# Patient Record
Sex: Female | Born: 1965 | Race: White | Hispanic: No
Health system: Southern US, Community
[De-identification: ages and names within clinical notes are randomized; demographics above are authoritative.]

## PROBLEM LIST (undated history)

## (undated) DIAGNOSIS — R519 Headache, unspecified: Secondary | ICD-10-CM

## (undated) HISTORY — PX: TOTAL KNEE ARTHROPLASTY: SHX125

---

## 2019-04-16 LAB — EXTERNAL GENERIC LAB PROCEDURE: COLOGUARD: NEGATIVE

## 2019-04-16 LAB — COLOGUARD: COLOGUARD: NEGATIVE

## 2020-04-12 ENCOUNTER — Other Ambulatory Visit: Payer: Self-pay

## 2020-04-12 ENCOUNTER — Emergency Department
Admission: EM | Admit: 2020-04-12 | Discharge: 2020-04-12 | Disposition: A | Discharge status: Home / Self Care | Payer: 59 | Attending: Emergency Medicine | Admitting: Emergency Medicine

## 2020-04-12 ENCOUNTER — Emergency Department: Payer: 59

## 2020-04-12 DIAGNOSIS — N200 Calculus of kidney: Secondary | ICD-10-CM | POA: Insufficient documentation

## 2020-04-12 DIAGNOSIS — R109 Unspecified abdominal pain: Secondary | ICD-10-CM

## 2020-04-12 LAB — URINALYSIS, COMPLETE (UACMP) WITH MICROSCOPIC
Bilirubin Urine: NEGATIVE
Glucose, UA: NEGATIVE mg/dL
Ketones, ur: NEGATIVE mg/dL
Leukocytes,Ua: NEGATIVE
Nitrite: NEGATIVE
Protein, ur: NEGATIVE mg/dL
Specific Gravity, Urine: 1.02 (ref 1.005–1.030)
pH: 6 (ref 5.0–8.0)

## 2020-04-12 LAB — BASIC METABOLIC PANEL
Anion gap: 10 (ref 5–15)
BUN: 28 mg/dL — ABNORMAL HIGH (ref 6–20)
CO2: 21 mmol/L — ABNORMAL LOW (ref 22–32)
Calcium: 9.6 mg/dL (ref 8.9–10.3)
Chloride: 103 mmol/L (ref 98–111)
Creatinine, Ser: 0.92 mg/dL (ref 0.44–1.00)
GFR, Estimated: >60 mL/min (ref >60)
Glucose, Bld: 111 mg/dL — ABNORMAL HIGH (ref 70–99)
Potassium: 3.8 mmol/L (ref 3.5–5.1)
Sodium: 134 mmol/L — ABNORMAL LOW (ref 135–145)

## 2020-04-12 LAB — CBC
HCT: 38.5 % (ref 36.0–46.0)
Hemoglobin: 13 g/dL (ref 12.0–15.0)
MCH: 28.6 pg (ref 26.0–34.0)
MCHC: 33.8 g/dL (ref 30.0–36.0)
MCV: 84.6 fL (ref 80.0–100.0)
Platelets: 311 10*3/uL (ref 150–400)
RBC: 4.55 MIL/uL (ref 3.87–5.11)
RDW: 12.5 % (ref 11.5–15.5)
WBC: 10.6 10*3/uL — ABNORMAL HIGH (ref 4.0–10.5)
nRBC: 0 % (ref 0.0–0.2)

## 2020-04-12 MED ORDER — CEPHALEXIN 500 MG PO CAPS: 500.0000 mg | ORAL_CAPSULE | Freq: Three times a day (TID) | ORAL | 0 refills | Status: DC

## 2020-04-12 MED ORDER — FENTANYL CITRATE (PF) 100 MCG/2ML IJ SOLN
50.0000 ug | Freq: Once | INTRAMUSCULAR | Status: AC
Start: 2020-04-12 — End: 2020-04-12
  Administered 2020-04-12: 50 ug via INTRAMUSCULAR
  Filled 2020-04-12: qty 2

## 2020-04-12 MED ORDER — TAMSULOSIN HCL 0.4 MG PO CAPS: 0.4000 mg | ORAL_CAPSULE | Freq: Every day | ORAL | 0 refills | Status: AC

## 2020-04-12 MED ORDER — TAMSULOSIN HCL 0.4 MG PO CAPS: 0.4000 mg | ORAL_CAPSULE | Freq: Every day | ORAL | 0 refills | Status: DC

## 2020-04-12 MED ORDER — ONDANSETRON 4 MG PO TBDP: 4.0000 mg | ORAL_TABLET | Freq: Three times a day (TID) | ORAL | 0 refills | Status: AC | PRN

## 2020-04-12 MED ORDER — OXYCODONE-ACETAMINOPHEN 5-325 MG PO TABS: 1.0000 | ORAL_TABLET | Freq: Four times a day (QID) | ORAL | 0 refills | Status: DC | PRN

## 2020-04-12 MED ORDER — OXYCODONE-ACETAMINOPHEN 5-325 MG PO TABS
1.0000 | ORAL_TABLET | Freq: Four times a day (QID) | ORAL | 0 refills | Status: AC | PRN
Start: 2020-04-12 — End: 2020-04-15

## 2020-04-12 MED ORDER — ONDANSETRON 4 MG PO TBDP: 4.0000 mg | ORAL_TABLET | Freq: Three times a day (TID) | ORAL | 0 refills | Status: DC | PRN

## 2020-04-12 MED ORDER — CEPHALEXIN 500 MG PO CAPS: 500.0000 mg | ORAL_CAPSULE | Freq: Three times a day (TID) | ORAL | 0 refills | Status: AC

## 2020-04-12 NOTE — ED Triage Notes (Signed)
Pt states that a few hours ago she started having right flank pain, states that she is also having some difficulty urination, states that she thinks that it may be a kidney stone due to having one 3 years ago. Pt reports currently taking tramadol and prednisone for a knee injury.

## 2020-04-12 NOTE — ED Notes (Signed)
Patient reports right flank pain x 1 day. Patient reports some intermittent nausea as well. Patient reports some relative dysuria x 2 days. Patient reports hx of kidney stones. Patient denies abdominal pain.

## 2020-04-12 NOTE — ED Provider Notes (Signed)
[facility]-EMERGENCY DEPARTMENT  ____________________________________________  Time seen: Approximately 3:15 PM  I have reviewed the triage vital signs and the nursing notes.   HISTORY  Chief Complaint Flank Pain   Historian Patient     HPI Courtney Rivas is a 55 y.o. female with a history of nephrolithiasis, presents to the emergency department with acute right flank pain that started earlier today.  Patient reports some nausea and diminished stream.  She states that it feels similar to kidney stone pain that she experienced 3 years ago. No dysuria or hematuria.  She is currently taking tramadol and prednisone for a left knee injury.  She denies falls or mechanisms of trauma.  No fever noted at home.   No past medical history on file.   Immunizations up to date:  Yes.     No past medical history on file.  There are no problems to display for this patient.     Prior to Admission medications   Medication Sig Start Date End Date Taking? Authorizing Provider  cephALEXin (KEFLEX) 500 MG capsule Take 1 capsule (500 mg total) by mouth 3 (three) times daily for 7 days. 04/12/20 04/19/20 Yes Pia Mau M, PA-C  ondansetron (ZOFRAN ODT) 4 MG disintegrating tablet Take 1 tablet (4 mg total) by mouth every 8 (eight) hours as needed for up to 5 days. 04/12/20 04/17/20 Yes Pia Mau M, PA-C  oxyCODONE-acetaminophen (PERCOCET/ROXICET) 5-325 MG tablet Take 1 tablet by mouth every 6 (six) hours as needed for up to 3 days. 04/12/20 04/15/20 Yes Pia Mau M, PA-C  predniSONE (DELTASONE) 10 MG tablet Take 10 mg by mouth 2 (two) times daily with a meal.   Yes [provider]  tamsulosin (FLOMAX) 0.4 MG CAPS capsule Take 1 capsule (0.4 mg total) by mouth daily for 5 days. 04/12/20 04/17/20 Yes Pia Mau M, PA-C  azelastine (OPTIVAR) 0.05 % ophthalmic solution Apply to eye. 02/25/20   [provider]  meloxicam (MOBIC) 7.5 MG tablet SMARTSIG:1 Tablet(s) By Mouth Every 12  Hours 02/14/20   [provider]  montelukast (SINGULAIR) 10 MG tablet Take 10 mg by mouth daily. 04/08/20   [provider]  traMADol (ULTRAM) 50 MG tablet Take 50 mg by mouth every 8 (eight) hours. 02/04/20   [provider]    Allergies Ivp dye [iodinated diagnostic agents]  No family history on file.  Social History     Review of Systems  Constitutional: No fever/chills Eyes:  No discharge ENT: No upper respiratory complaints. Respiratory: no cough. No SOB/ use of accessory muscles to breath Gastrointestinal:   No nausea, no vomiting.  No diarrhea.  No constipation. Genitourinary: Patient has right sided flank pain.  Musculoskeletal: Negative for musculoskeletal pain. Skin: Negative for rash, abrasions, lacerations, ecchymosis.    ____________________________________________   PHYSICAL EXAM:  VITAL SIGNS: ED Triage Vitals  Enc Vitals Group     BP 04/12/20 1359 (!) 143/90     Pulse Rate 04/12/20 1359 81     Resp 04/12/20 1359 20     Temp 04/12/20 1359 97.7 F (36.5 C)     Temp Source 04/12/20 1359 Oral     SpO2 04/12/20 1359 98 %     Weight 04/12/20 1416 183 lb (83 kg)     Height 04/12/20 1416 5\' 4"  (1.626 m)     Head Circumference --      Peak Flow --      Pain Score 04/12/20 1416 6     Pain Loc --  Pain Edu? --      Excl. in GC? --      Constitutional: Alert and oriented. Well appearing and in no acute distress. Eyes: Conjunctivae are normal. PERRL. EOMI. Head: Atraumatic. Cardiovascular: Normal rate, regular rhythm. Normal S1 and S2.  Good peripheral circulation. Respiratory: Normal respiratory effort without tachypnea or retractions. Lungs CTAB. Good air entry to the bases with no decreased or absent breath sounds Gastrointestinal: Bowel sounds x 4 quadrants. Soft and nontender to palpation. No guarding or rigidity. No distention. Musculoskeletal: Full range of motion to all extremities. No obvious deformities  noted Neurologic:  Normal for age. No gross focal neurologic deficits are appreciated.  Skin:  Skin is warm, dry and intact. No rash noted. Psychiatric: Mood and affect are normal for age. Speech and behavior are normal.   ____________________________________________   LABS (all labs ordered are listed, but only abnormal results are displayed)  Labs Reviewed  URINALYSIS, COMPLETE (UACMP) WITH MICROSCOPIC - Abnormal; Notable for the following components:      Result Value   Hgb urine dipstick TRACE (*)    Bacteria, UA RARE (*)    All other components within normal limits  BASIC METABOLIC PANEL - Abnormal; Notable for the following components:   Sodium 134 (*)    CO2 21 (*)    Glucose, Bld 111 (*)    BUN 28 (*)    All other components within normal limits  CBC - Abnormal; Notable for the following components:   WBC 10.6 (*)    All other components within normal limits  POC URINE PREG, ED   ____________________________________________  EKG   ____________________________________________  RADIOLOGY Geraldo Pitter, personally viewed and evaluated these images (plain radiographs) as part of my medical decision making, as well as reviewing the written report by the radiologist.    CT Renal Stone Study  Result Date: 04/12/2020 CLINICAL DATA:  Right flank pain. EXAM: CT ABDOMEN AND PELVIS WITHOUT CONTRAST TECHNIQUE: Multidetector CT imaging of the abdomen and pelvis was performed following the standard protocol without IV contrast. COMPARISON:  None. FINDINGS: Lower chest: No acute abnormality. Hepatobiliary: No focal liver abnormality is seen. Ill-defined gallstones are seen within the lumen of an otherwise normal-appearing gallbladder. Pancreas: Unremarkable. No pancreatic ductal dilatation or surrounding inflammatory changes. Spleen: Normal in size without focal abnormality. Adrenals/Urinary Tract: Adrenal glands are unremarkable. Kidneys are normal in size, without focal  lesions. A 3 mm obstructing renal stone seen at the right UVJ, with mild right-sided hydronephrosis, hydroureter and perinephric inflammatory fat stranding. Bladder is unremarkable. Stomach/Bowel: Stomach is within normal limits. Appendix appears normal. No evidence of bowel wall thickening, distention, or inflammatory changes. Noninflamed diverticula are seen within the sigmoid colon. Vascular/Lymphatic: Aortic atherosclerosis. No enlarged abdominal or pelvic lymph nodes. Reproductive: The uterus is normal in size and appearance. A 4.1 cm x 3.7 cm well-defined area of heterogeneous attenuation is seen within the right adnexa. Other: No abdominal wall hernia or abnormality. No abdominopelvic ascites. Musculoskeletal: No acute or significant osseous findings. IMPRESSION: 1. 3 mm obstructing renal stone at the right UVJ. 2. Cholelithiasis. 3. Sigmoid diverticulosis. 4. Aortic atherosclerosis. Aortic Atherosclerosis (ICD10-I70.0). Electronically Signed   By: Aram Candela M.D.   On: 04/12/2020 15:41    ____________________________________________    PROCEDURES  Procedure(s) performed:     Procedures     Medications  fentaNYL (SUBLIMAZE) injection 50 mcg (50 mcg Intramuscular Given 04/12/20 1538)     ____________________________________________   INITIAL IMPRESSION / ASSESSMENT AND  PLAN / ED COURSE  Pertinent labs & imaging results that were available during my care of the patient were reviewed by me and considered in my medical decision making (see chart for details).      Assessment and plan:  Flank pain:  55 year old female presents to the emergency department with right-sided flank pain that started today.  Patient was hypertensive at triage but vital signs were otherwise reassuring.  CT renal stone study indicated a 3 mm obstructing renal calculi at the UVJ.  Patient was given fentanyl in the emergency department for pain.  She was discharged with a short course of Percocet  and Flomax.  She was advised to hold her Toradol while taking Percocet.  There were rare bacteria identified on UA and patient was started on Keflex.  She was advised to follow-up with urology as needed.  All patient questions were answered.   ____________________________________________  FINAL CLINICAL IMPRESSION(S) / ED DIAGNOSES  Final diagnoses:  Flank pain  Nephrolithiasis      NEW MEDICATIONS STARTED DURING THIS VISIT:  ED Discharge Orders         Ordered    oxyCODONE-acetaminophen (PERCOCET/ROXICET) 5-325 MG tablet  Every 6 hours PRN        04/12/20 1557    tamsulosin (FLOMAX) 0.4 MG CAPS capsule  Daily        04/12/20 1557    cephALEXin (KEFLEX) 500 MG capsule  3 times daily        04/12/20 1557    ondansetron (ZOFRAN ODT) 4 MG disintegrating tablet  Every 8 hours PRN        04/12/20 1558              This chart was dictated using voice recognition software/Dragon. Despite best efforts to proofread, errors can occur which can change the meaning. Any change was purely unintentional.     Orvil Feil, PA-C 04/12/20 1608    Minna Antis, MD 04/12/20 2248

## 2020-04-12 NOTE — Discharge Instructions (Addendum)
Hold Tramadol Take Roxicet for pain Take Flomax once daily  Take Keflex three times daily for the next seven days.

## 2020-11-16 ENCOUNTER — Other Ambulatory Visit: Payer: Self-pay | Admitting: Surgery

## 2020-11-16 ENCOUNTER — Other Ambulatory Visit (HOSPITAL_COMMUNITY): Payer: Self-pay | Admitting: Surgery

## 2020-11-16 DIAGNOSIS — M67951 Unspecified disorder of synovium and tendon, right thigh: Secondary | ICD-10-CM

## 2020-11-16 DIAGNOSIS — Z96652 Presence of left artificial knee joint: Secondary | ICD-10-CM

## 2020-11-16 DIAGNOSIS — T8484XD Pain due to internal orthopedic prosthetic devices, implants and grafts, subsequent encounter: Secondary | ICD-10-CM

## 2020-12-08 ENCOUNTER — Other Ambulatory Visit
Admission: RE | Admit: 2020-12-08 | Discharge: 2020-12-08 | Disposition: A | Discharge status: Home / Self Care | Payer: 59 | Source: Ambulatory Visit | Attending: Sports Medicine | Admitting: Sports Medicine

## 2020-12-08 DIAGNOSIS — M25462 Effusion, left knee: Secondary | ICD-10-CM | POA: Diagnosis not present

## 2020-12-08 LAB — SYNOVIAL CELL COUNT + DIFF, W/ CRYSTALS
Crystals, Fluid: NONE SEEN
Eosinophils-Synovial: 0 %
Lymphocytes-Synovial Fld: 93 %
Monocyte-Macrophage-Synovial Fluid: 2 %
Neutrophil, Synovial: 5 %
Other Cells-SYN: 0
WBC, Synovial: 76 /mm3 (ref 0–200)

## 2020-12-09 ENCOUNTER — Ambulatory Visit: Payer: 59

## 2020-12-09 ENCOUNTER — Other Ambulatory Visit: Payer: Self-pay

## 2020-12-09 ENCOUNTER — Ambulatory Visit
Admission: RE | Admit: 2020-12-09 | Discharge: 2020-12-09 | Disposition: A | Discharge status: Home / Self Care | Payer: 59 | Source: Ambulatory Visit | Attending: Surgery | Admitting: Surgery

## 2020-12-09 DIAGNOSIS — M67951 Unspecified disorder of synovium and tendon, right thigh: Secondary | ICD-10-CM | POA: Insufficient documentation

## 2020-12-09 DIAGNOSIS — T8484XD Pain due to internal orthopedic prosthetic devices, implants and grafts, subsequent encounter: Secondary | ICD-10-CM | POA: Diagnosis present

## 2020-12-09 DIAGNOSIS — Z96652 Presence of left artificial knee joint: Secondary | ICD-10-CM | POA: Insufficient documentation

## 2021-01-09 ENCOUNTER — Ambulatory Visit
Admission: RE | Admit: 2021-01-09 | Discharge: 2021-01-09 | Disposition: A | Discharge status: Home / Self Care | Payer: 59 | Source: Ambulatory Visit | Attending: Surgery | Admitting: Surgery

## 2021-01-09 DIAGNOSIS — T8484XD Pain due to internal orthopedic prosthetic devices, implants and grafts, subsequent encounter: Secondary | ICD-10-CM | POA: Diagnosis present

## 2021-01-09 DIAGNOSIS — Z96652 Presence of left artificial knee joint: Secondary | ICD-10-CM | POA: Diagnosis present

## 2021-02-05 ENCOUNTER — Encounter (HOSPITAL_COMMUNITY): Payer: Self-pay | Admitting: Radiology

## 2021-05-29 ENCOUNTER — Other Ambulatory Visit: Payer: Self-pay | Admitting: Surgery

## 2021-05-29 DIAGNOSIS — M4807 Spinal stenosis, lumbosacral region: Secondary | ICD-10-CM

## 2021-06-15 ENCOUNTER — Other Ambulatory Visit: Payer: Self-pay | Admitting: Surgery

## 2021-06-15 DIAGNOSIS — M4800 Spinal stenosis, site unspecified: Secondary | ICD-10-CM

## 2021-07-20 ENCOUNTER — Other Ambulatory Visit: Payer: Self-pay | Admitting: Surgery

## 2021-07-20 DIAGNOSIS — M47816 Spondylosis without myelopathy or radiculopathy, lumbar region: Secondary | ICD-10-CM

## 2021-07-20 DIAGNOSIS — M4807 Spinal stenosis, lumbosacral region: Secondary | ICD-10-CM

## 2021-08-06 ENCOUNTER — Other Ambulatory Visit: Payer: Self-pay | Admitting: Surgery

## 2021-08-06 ENCOUNTER — Other Ambulatory Visit (HOSPITAL_COMMUNITY): Payer: Self-pay | Admitting: Surgery

## 2021-08-06 DIAGNOSIS — M47816 Spondylosis without myelopathy or radiculopathy, lumbar region: Secondary | ICD-10-CM

## 2021-09-07 ENCOUNTER — Ambulatory Visit: Admission: RE | Admit: 2021-09-07 | Payer: 59 | Source: Ambulatory Visit

## 2021-09-07 ENCOUNTER — Ambulatory Visit
Admission: RE | Admit: 2021-09-07 | Discharge: 2021-09-07 | Disposition: A | Discharge status: Home / Self Care | Payer: 59 | Source: Ambulatory Visit | Attending: Surgery | Admitting: Surgery

## 2021-09-07 DIAGNOSIS — M47816 Spondylosis without myelopathy or radiculopathy, lumbar region: Secondary | ICD-10-CM | POA: Diagnosis present

## 2021-12-26 ENCOUNTER — Other Ambulatory Visit: Payer: Self-pay | Admitting: Surgery

## 2021-12-26 DIAGNOSIS — Z96652 Presence of left artificial knee joint: Secondary | ICD-10-CM

## 2022-02-21 ENCOUNTER — Other Ambulatory Visit: Payer: Self-pay | Admitting: Surgery

## 2022-02-21 DIAGNOSIS — Z96652 Presence of left artificial knee joint: Secondary | ICD-10-CM

## 2022-02-21 DIAGNOSIS — Z96649 Presence of unspecified artificial hip joint: Secondary | ICD-10-CM

## 2022-02-28 ENCOUNTER — Ambulatory Visit: Payer: 59

## 2022-04-19 ENCOUNTER — Ambulatory Visit: Payer: Self-pay | Admitting: Family Medicine

## 2022-04-28 IMAGING — CT CT KNEE*L* W/O CM
4 of 6 series · 14 of 33 positions shown, 16 images · non-contrast
Comparison: None.

CLINICAL DATA: Left knee pain status post total left knee
replacement.

EXAM:
CT OF THE LEFT KNEE WITHOUT CONTRAST
TECHNIQUE: Multidetector CT imaging of the left knee was performed according to
the standard protocol. Multiplanar CT image reconstructions were
also generated.

[Series 4: axial bone · axial · 0.41mm/px · z∈[+484,+672]mm · 5 of 189 slices shown, 7 images]
[im 32/189  soft-tissue]
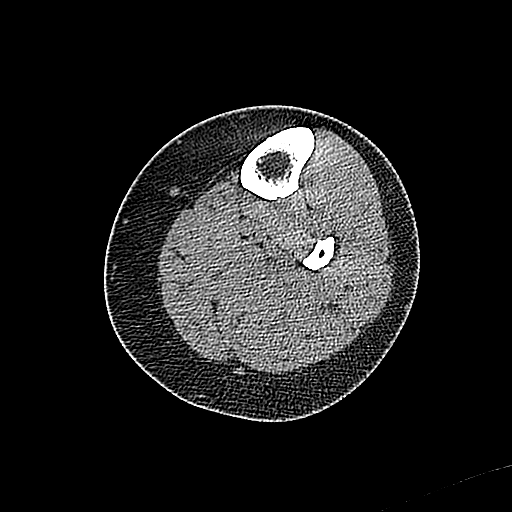
[im 32/189  bone]
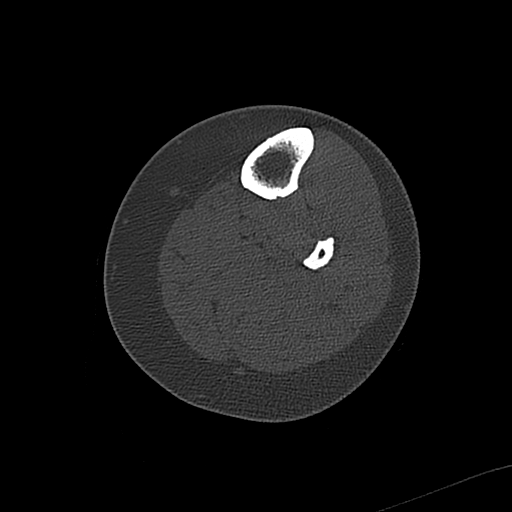
[im 63/189  bone]
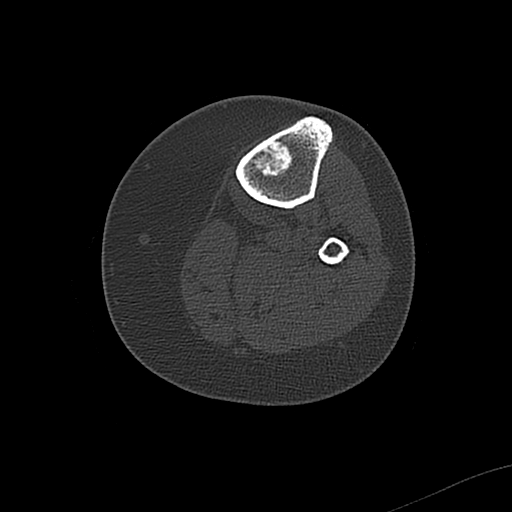
[im 95/189  bone]
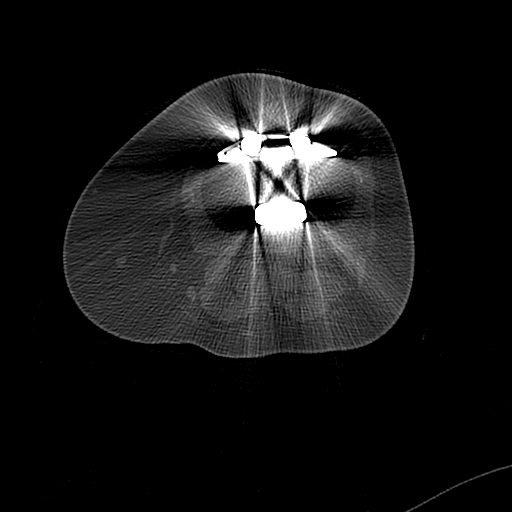
[im 126/189  bone]
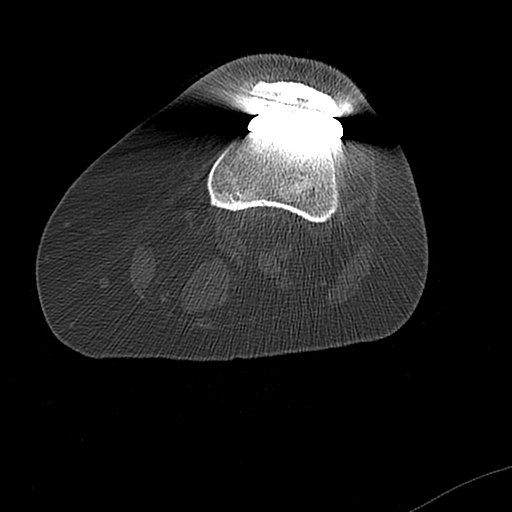
[im 157/189  soft-tissue]
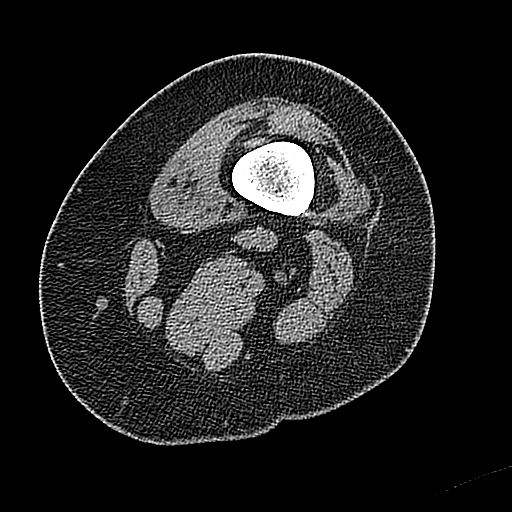
[im 157/189  bone]
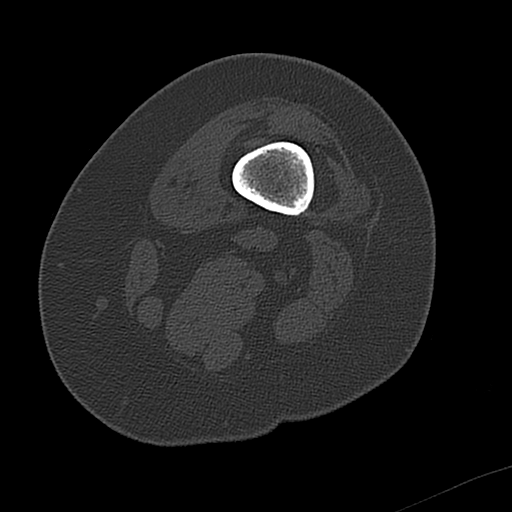

[Series 5: axial (person_name) · axial · 0.41mm/px · z∈[+484,+578]mm · 3 of 189 slices shown]
[im 32/189  bone]
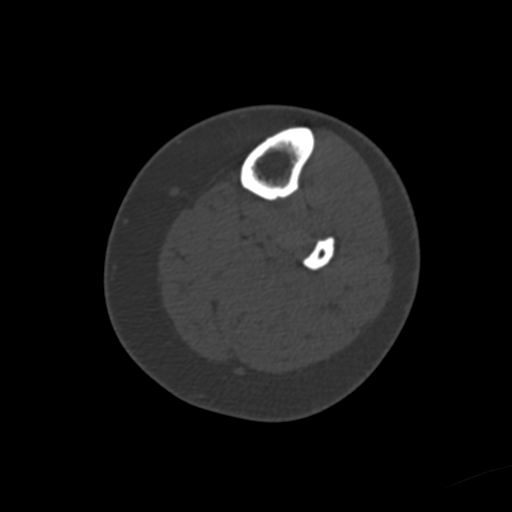
[im 63/189  bone]
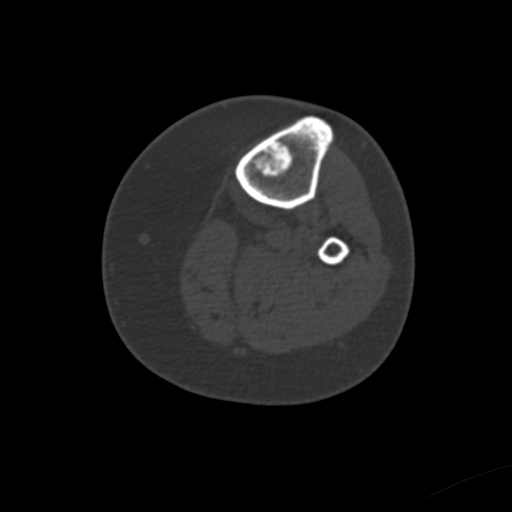
[im 95/189  bone]
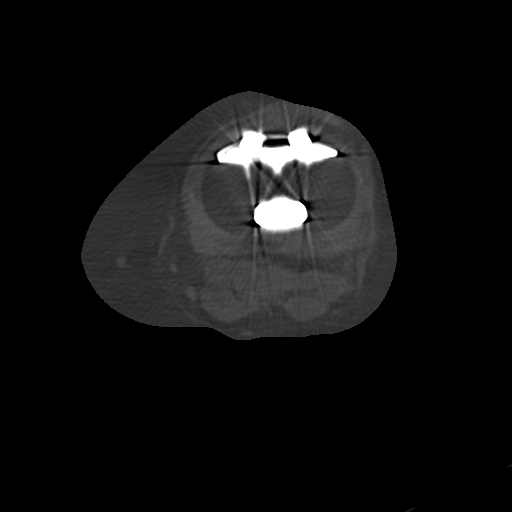

[Series 6: coronal bone · coronal · 0.39mm/px · 1 of 131 slices shown]
[im 66/131  bone]
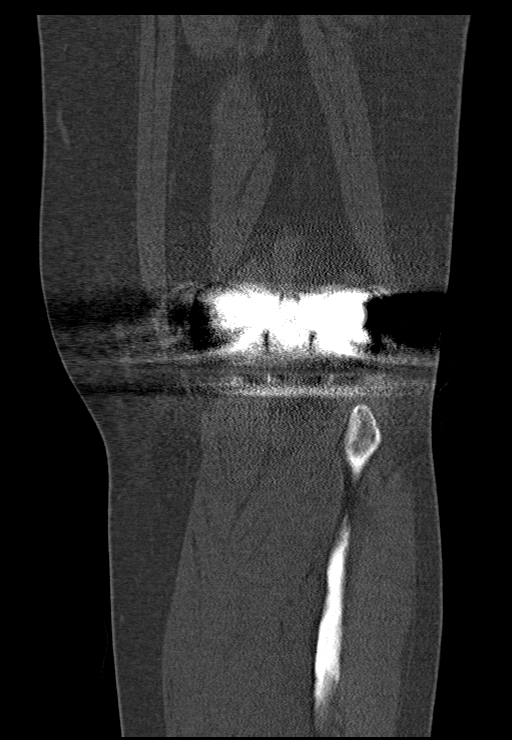

[Series 7: sagittal bone · sagittal · 0.37mm/px · 5 of 122 slices shown]
[im 21/122  bone]
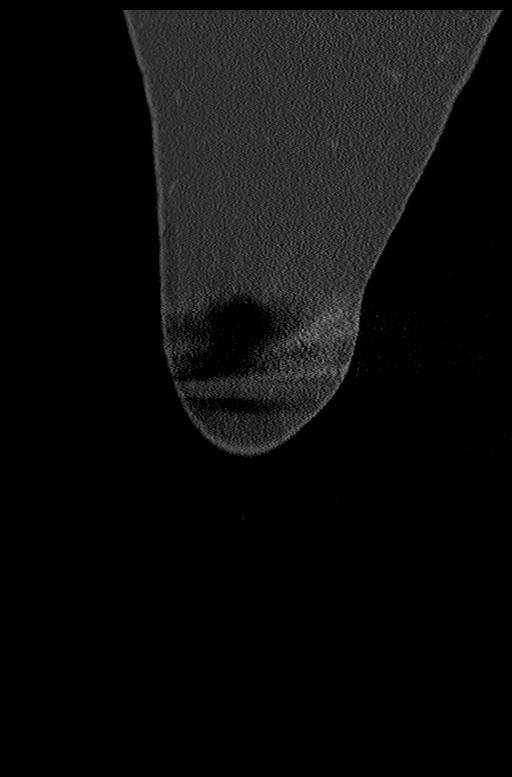
[im 41/122  bone]
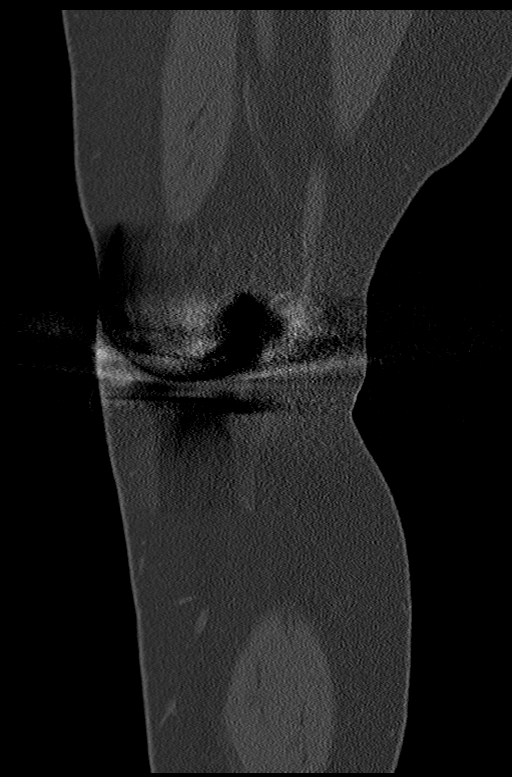
[im 61/122  bone]
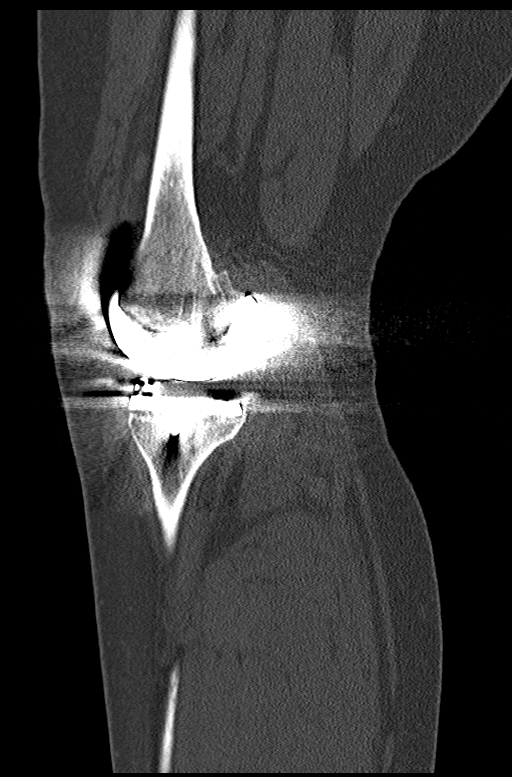
[im 81/122  bone]
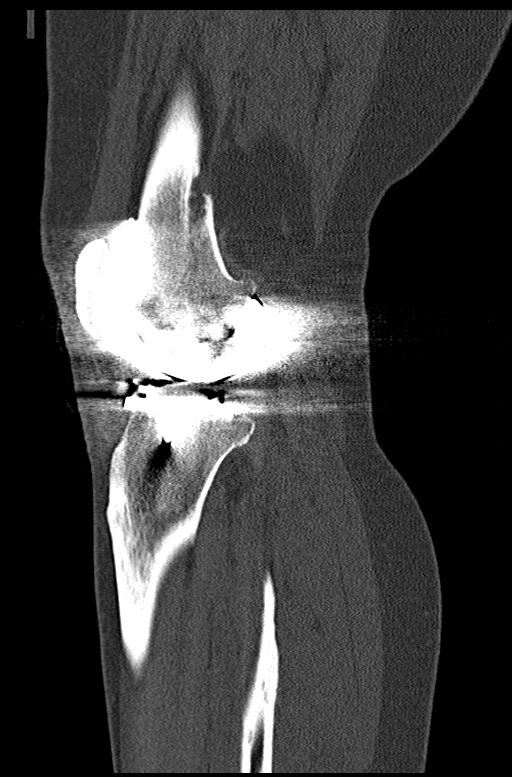
[im 101/122  bone]
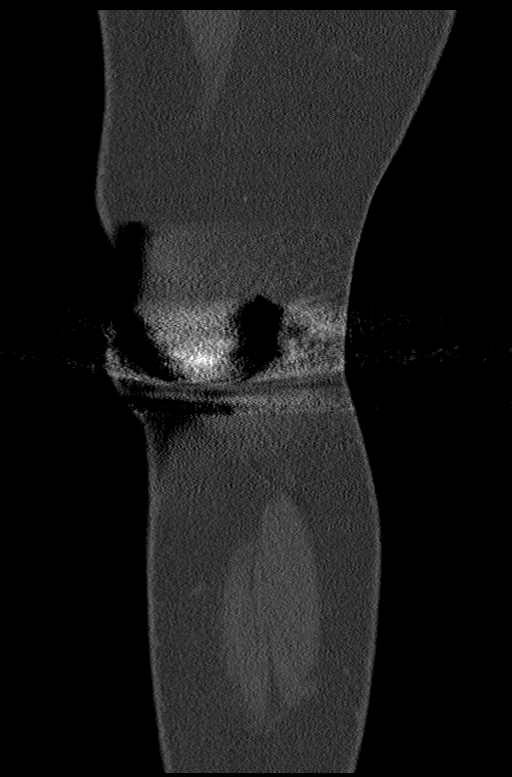

[14 of 33 positions shown; findings below may reference images not displayed]

FINDINGS: FINDINGS

Bones/Joint/Cartilage

Postsurgical changes status post left total knee arthroplasty.
Arthroplasty components are in their expected alignment. No
periprosthetic fracture. Focal periprosthetic cystic change within
the medial tibial plateau measuring 14 x 10 x 14 mm with mild
adjacent perihardware lucency (series 6, images 46-48). Prominent
reaming track within the distal femur which extends through the
posterolateral cortex of the distal femoral metadiaphysis (series 6,
images 46-49). Osseous structures appear otherwise intact and
unremarkable. Trace knee joint effusion, nonspecific.

Ligaments

Suboptimally assessed by CT.

Muscles and Tendons

No acute musculotendinous abnormality by CT. Extensor mechanism
appears grossly intact within the limitations of this exam.

Soft tissues

No soft tissue edema or fluid collection.
IMPRESSION: 1. Postsurgical changes status post left total knee arthroplasty
without periprosthetic fracture.
2. Focal periprosthetic cystic change within the medial tibial
plateau measuring 14 x 10 x 14 mm with mild adjacent perihardware
lucency, concerning for loosening.
3. Prominent reaming track within the distal femur which extends
through the posterolateral cortex of the distal femoral
metadiaphysis.
4. Trace knee joint effusion, nonspecific.

## 2022-12-01 LAB — EXTERNAL GENERIC LAB PROCEDURE: COLOGUARD: NEGATIVE

## 2022-12-01 LAB — COLOGUARD: COLOGUARD: NEGATIVE

## 2023-05-22 ENCOUNTER — Other Ambulatory Visit: Payer: Self-pay | Admitting: Surgery

## 2023-06-02 ENCOUNTER — Encounter
Admission: RE | Admit: 2023-06-02 | Discharge: 2023-06-02 | Disposition: A | Discharge status: Home / Self Care | Source: Ambulatory Visit | Attending: Surgery | Admitting: Surgery

## 2023-06-02 ENCOUNTER — Encounter: Payer: Self-pay | Admitting: Urgent Care

## 2023-06-02 ENCOUNTER — Other Ambulatory Visit: Payer: Self-pay

## 2023-06-02 VITALS — Ht 64.0 in | Wt 200.0 lb

## 2023-06-02 DIAGNOSIS — Z01818 Encounter for other preprocedural examination: Secondary | ICD-10-CM | POA: Diagnosis not present

## 2023-06-02 DIAGNOSIS — Z01810 Encounter for preprocedural cardiovascular examination: Secondary | ICD-10-CM | POA: Diagnosis present

## 2023-06-02 DIAGNOSIS — Z01812 Encounter for preprocedural laboratory examination: Secondary | ICD-10-CM | POA: Diagnosis present

## 2023-06-02 HISTORY — DX: Headache, unspecified: R51.9

## 2023-06-02 LAB — CBC WITH DIFFERENTIAL/PLATELET
Abs Immature Granulocytes: 0.02 10*3/uL (ref 0.00–0.07)
Basophils Absolute: 0 10*3/uL (ref 0.0–0.1)
Basophils Relative: 0 %
Eosinophils Absolute: 0.1 10*3/uL (ref 0.0–0.5)
Eosinophils Relative: 2 %
HCT: 40.7 % (ref 36.0–46.0)
Hemoglobin: 14 g/dL (ref 12.0–15.0)
Immature Granulocytes: 0 %
Lymphocytes Relative: 28 %
Lymphs Abs: 2 10*3/uL (ref 0.7–4.0)
MCH: 28.9 pg (ref 26.0–34.0)
MCHC: 34.4 g/dL (ref 30.0–36.0)
MCV: 84.1 fL (ref 80.0–100.0)
Monocytes Absolute: 0.6 10*3/uL (ref 0.1–1.0)
Monocytes Relative: 8 %
Neutro Abs: 4.3 10*3/uL (ref 1.7–7.7)
Neutrophils Relative %: 62 %
Platelets: 329 10*3/uL (ref 150–400)
RBC: 4.84 MIL/uL (ref 3.87–5.11)
RDW: 12.1 % (ref 11.5–15.5)
WBC: 7 10*3/uL (ref 4.0–10.5)
nRBC: 0 % (ref 0.0–0.2)

## 2023-06-02 LAB — SURGICAL PCR SCREEN
MRSA, PCR: NEGATIVE
Staphylococcus aureus: NEGATIVE

## 2023-06-02 LAB — URINALYSIS, ROUTINE W REFLEX MICROSCOPIC
Bilirubin Urine: NEGATIVE
Glucose, UA: NEGATIVE mg/dL
Hgb urine dipstick: NEGATIVE
Ketones, ur: NEGATIVE mg/dL
Leukocytes,Ua: NEGATIVE
Nitrite: NEGATIVE
Protein, ur: NEGATIVE mg/dL
Specific Gravity, Urine: 1.002 — ABNORMAL LOW (ref 1.005–1.030)
pH: 7 (ref 5.0–8.0)

## 2023-06-02 LAB — COMPREHENSIVE METABOLIC PANEL WITH GFR
ALT: 17 U/L (ref 0–44)
AST: 19 U/L (ref 15–41)
Albumin: 4.4 g/dL (ref 3.5–5.0)
Alkaline Phosphatase: 69 U/L (ref 38–126)
Anion gap: 8 (ref 5–15)
BUN: 17 mg/dL (ref 6–20)
CO2: 23 mmol/L (ref 22–32)
Calcium: 9.4 mg/dL (ref 8.9–10.3)
Chloride: 106 mmol/L (ref 98–111)
Creatinine, Ser: 0.48 mg/dL (ref 0.44–1.00)
GFR, Estimated: >60 mL/min (ref >60)
Glucose, Bld: 91 mg/dL (ref 70–99)
Potassium: 3.9 mmol/L (ref 3.5–5.1)
Sodium: 137 mmol/L (ref 135–145)
Total Bilirubin: 0.7 mg/dL (ref 0.0–1.2)
Total Protein: 7.3 g/dL (ref 6.5–8.1)

## 2023-06-02 NOTE — Patient Instructions (Addendum)
 Your procedure is scheduled on: Tuesday 06/10/23 To find out your arrival time, please call [phone] between 1PM - 3PM on:  Monday 06/09/23  Report to the Registration Desk on the 1st floor of the [facility]. Free Valet parking is available.  If your arrival time is 6:00 am, do not arrive before that time as the [facility] entrance doors do not open until 6:00 am.  REMEMBER: Instructions that are not followed completely may result in serious medical risk, up to and including death; or upon the discretion of your surgeon and anesthesiologist your surgery may need to be rescheduled.  Do not eat food after midnight the night before surgery.  No gum chewing or hard candies.  You may however, drink CLEAR liquids up to 2 hours before you are scheduled to arrive for your surgery. Do not drink anything within 2 hours of your scheduled arrival time.  Clear liquids include: - water  - apple juice without pulp - gatorade (not RED colors) - black coffee or tea (Do NOT add milk or creamers to the coffee or tea) Do NOT drink anything that is not on this list.  Type 1 and Type 2 diabetics should only drink water.  In addition, your doctor has ordered for you to drink the provided:  Ensure Pre-Surgery Clear Carbohydrate Drink  Drinking this carbohydrate drink up to two hours before surgery helps to reduce insulin resistance and improve patient outcomes. Please complete drinking 2 hours before scheduled arrival time.  One week prior to surgery: Stop Anti-inflammatories (NSAIDS) such as Advil, Aleve, Ibuprofen, Motrin, Naproxen, Naprosyn and Aspirin based products such as Excedrin, Goody's Powder, BC Powder. You will need to also stop your Meloxicam today 06/02/23 You may however, continue to take Tylenol  if needed for pain up until the day of surgery.  Stop ANY OVER THE COUNTER supplements and vitamins also today 06/02/23 until after surgery.  Continue taking all prescribed medications with the  exception of the following: (see meloxicam above)  TAKE ONLY THESE MEDICATIONS THE MORNING OF SURGERY WITH A SIP OF WATER:  cetirizine (ZYRTEC) 10 MG tablet  gabapentin (NEURONTIN) 600 MG capsule  montelukast (SINGULAIR) 10 MG tablet   No Alcohol for 24 hours before or after surgery.  No Smoking including e-cigarettes for 24 hours before surgery.  No chewable tobacco products for at least 6 hours before surgery.  No nicotine patches on the day of surgery.  Do not use any "recreational" drugs for at least a week (preferably 2 weeks) before your surgery.  Please be advised that the combination of cocaine and anesthesia may have negative outcomes, up to and including death. If you test positive for cocaine, your surgery will be cancelled.  On the morning of surgery brush your teeth with toothpaste and water, you may rinse your mouth with mouthwash if you wish. Do not swallow any toothpaste or mouthwash.  Use CHG Soap or wipes as directed on instruction sheet.  Do not wear lotions, powders, or perfumes on the day of surgery.  Do not shave body hair from the neck down (see below).  Wear comfortable clothing (specific to your surgery type) to the hospital.  Do not wear jewelry, make-up, hairpins, clips or nail or toenail polish.  For welded (permanent) jewelry: bracelets, anklets, waist bands, etc.  Please have this removed prior to surgery.  If it is not removed, there is a chance that hospital personnel will need to cut it off on the day of surgery. Contact  lenses, hearing aids and dentures may not be worn into surgery. Bring a case for your glasses  Do not bring valuables to the hospital. [facility] is not responsible for any missing/lost belongings or valuables.   Notify your doctor if there is any change in your medical condition (cold, fever, infection).  If you are being discharged the day of surgery, you will not be allowed to drive home. You will need a responsible  individual to drive you home and stay with you for 24 hours after surgery.   If you are taking public transportation, you will need to have a responsible individual with you.  If you are being admitted to the hospital overnight, leave your suitcase in the car. After surgery it may be brought to your room.  In case of increased patient census, it may be necessary for you, the patient, to continue your postoperative care in the Same Day Surgery department.  After surgery, you can help prevent lung complications by doing breathing exercises.  Take deep breaths and cough every 1-2 hours. Your doctor may order a device called an Incentive Spirometer to help you take deep breaths.  Surgery Visitation Policy:  Patients undergoing a surgery or procedure may have two family members or support persons with them as long as the person is not COVID-19 positive or experiencing its symptoms.   Please call the Pre-admissions Testing Dept. at [phone] if you have any questions about these instructions.    Pre-operative 5 CHG Bath Instructions   You can play a key role in reducing the risk of infection after surgery. Your skin needs to be as free of germs as possible. You can reduce the number of germs on your skin by washing with CHG (chlorhexidine gluconate) soap before surgery. CHG is an antiseptic soap that kills germs and continues to kill germs even after washing.   DO NOT use if you have an allergy to chlorhexidine/CHG or antibacterial soaps. If your skin becomes reddened or irritated, stop using the CHG and notify one of our RNs at [phone].   Please shower with the CHG soap starting 4 days before surgery using the following schedule:   Friday 06/06/23 - Tuesday 06/10/23    Please keep in mind the following:  DO NOT shave, including legs and underarms, starting the day of your first shower.   You may shave your face at any point before/day of surgery.  Place clean sheets on your bed  the day you start using CHG soap. Use a clean washcloth (not used since being washed) for each shower. DO NOT sleep with pets once you start using the CHG.   CHG Shower Instructions:  If you choose to wash your hair and private area, wash first with your normal shampoo/soap.  After you use shampoo/soap, rinse your hair and body thoroughly to remove shampoo/soap residue.  Turn the water OFF and apply about 3 tablespoons (45 ml) of CHG soap to a CLEAN washcloth.  Apply CHG soap ONLY FROM YOUR NECK DOWN TO YOUR TOES (washing for 3-5 minutes)  DO NOT use CHG soap on face, private areas, open wounds, or sores.  Pay special attention to the area where your surgery is being performed.  If you are having back surgery, having someone wash your back for you may be helpful. Wait 2 minutes after CHG soap is applied, then you may rinse off the CHG soap.  Pat dry with a clean towel  Put on clean clothes/pajamas  If you choose to wear lotion, please use ONLY the CHG-compatible lotions on the back of this paper.     Additional instructions for the day of surgery: DO NOT APPLY any lotions, deodorants, cologne, or perfumes.   Put on clean/comfortable clothes.  Brush your teeth.  Ask your nurse before applying any prescription medications to the skin.      CHG Compatible Lotions   Aveeno Moisturizing lotion  Cetaphil Moisturizing Cream  Cetaphil Moisturizing Lotion  Clairol Herbal Essence Moisturizing Lotion, Dry Skin  Clairol Herbal Essence Moisturizing Lotion, Extra Dry Skin  Clairol Herbal Essence Moisturizing Lotion, Normal Skin  Curel Age Defying Therapeutic Moisturizing Lotion with Alpha Hydroxy  Curel Extreme Care Body Lotion  Curel Soothing Hands Moisturizing Hand Lotion  Curel Therapeutic Moisturizing Cream, Fragrance-Free  Curel Therapeutic Moisturizing Lotion, Fragrance-Free  Curel Therapeutic Moisturizing Lotion, Original Formula  Eucerin Daily Replenishing Lotion  Eucerin Dry  Skin Therapy Plus Alpha Hydroxy Crme  Eucerin Dry Skin Therapy Plus Alpha Hydroxy Lotion  Eucerin Original Crme  Eucerin Original Lotion  Eucerin Plus Crme Eucerin Plus Lotion  Eucerin TriLipid Replenishing Lotion  Keri Anti-Bacterial Hand Lotion  Keri Deep Conditioning Original Lotion Dry Skin Formula Softly Scented  Keri Deep Conditioning Original Lotion, Fragrance Free Sensitive Skin Formula  Keri Lotion Fast Absorbing Fragrance Free Sensitive Skin Formula  Keri Lotion Fast Absorbing Softly Scented Dry Skin Formula  Keri Original Lotion  Keri Skin Renewal Lotion Keri Silky Smooth Lotion  Keri Silky Smooth Sensitive Skin Lotion  Nivea Body Creamy Conditioning Oil  Nivea Body Extra Enriched Lotion  Nivea Body Original Lotion  Nivea Body Sheer Moisturizing Lotion Nivea Crme  Nivea Skin Firming Lotion  NutraDerm 30 Skin Lotion  NutraDerm Skin Lotion  NutraDerm Therapeutic Skin Cream  NutraDerm Therapeutic Skin Lotion  ProShield Protective Hand Cream  Provon moisturizing lotion  How to Use an Incentive Spirometer  An incentive spirometer is a tool that measures how well you are filling your lungs with each breath. Learning to take long, deep breaths using this tool can help you keep your lungs clear and active. This may help to reverse or lessen your chance of developing breathing (pulmonary) problems, especially infection. You may be asked to use a spirometer: After a surgery. If you have a lung problem or a history of smoking. After a long period of time when you have been unable to move or be active. If the spirometer includes an indicator to show the highest number that you have reached, your health care provider or respiratory therapist will help you set a goal. Keep a log of your progress as told by your health care provider. What are the risks? Breathing too quickly may cause dizziness or cause you to pass out. Take your time so you do not get dizzy or light-headed. If you  are in pain, you may need to take pain medicine before doing incentive spirometry. It is harder to take a deep breath if you are having pain.   Sit up on the edge of your bed or on a chair. Hold the incentive spirometer so that it is in an upright position. Before you use the spirometer, breathe out normally. Place the mouthpiece in your mouth. Make sure your lips are closed tightly around it. Breathe in slowly and as deeply as you can through your mouth, causing the piston or the ball to rise toward the top of the chamber. Hold your breath for 3-5 seconds, or for as long as possible.  If the spirometer includes a coach indicator, use this to guide you in breathing. Slow down your breathing if the indicator goes above the marked areas. Remove the mouthpiece from your mouth and breathe out normally. The piston or ball will return to the bottom of the chamber. Rest for a few seconds, then repeat the steps 10 or more times. Take your time and take a few normal breaths between deep breaths so that you do not get dizzy or light-headed. Do this every 1-2 hours when you are awake. If the spirometer includes a goal marker to show the highest number you have reached (best effort), use this as a goal to work toward during each repetition. After each set of 10 deep breaths, cough a few times. This will help to make sure that your lungs are clear. If you have an incision on your chest or abdomen from surgery, place a pillow or a rolled-up towel firmly against the incision when you cough. This can help to reduce pain while taking deep breaths and coughing. General tips When you are able to get out of bed: Walk around often. Continue to take deep breaths and cough in order to clear your lungs. Keep using the incentive spirometer until your health care provider says it is okay to stop using it. If you have been in the hospital, you may be told to keep using the spirometer at home. Contact a health care  provider if: You are having difficulty using the spirometer. You have trouble using the spirometer as often as instructed. Your pain medicine is not giving enough relief for you to use the spirometer as told. You have a fever. Get help right away if: You develop shortness of breath. You develop a cough with bloody mucus from the lungs. You have fluid or blood coming from an incision site after you cough. Summary An incentive spirometer is a tool that can help you learn to take long, deep breaths to keep your lungs clear and active. You may be asked to use a spirometer after a surgery, if you have a lung problem or a history of smoking, or if you have been inactive for a long period of time. Use your incentive spirometer as instructed every 1-2 hours while you are awake. If you have an incision on your chest or abdomen, place a pillow or a rolled-up towel firmly against your incision when you cough. This will help to reduce pain. Get help right away if you have shortness of breath, you cough up bloody mucus, or blood comes from your incision when you cough. This information is not intended to replace advice given to you by your health care provider. Make sure you discuss any questions you have with your health care provider. Document Revised: 04/05/2019 Document Reviewed: 04/05/2019 Elsevier Patient Education  2023 [employer] Inc[employer]    Preoperative Educational Videos for Total Hip, Knee and Shoulder Replacements  To better prepare for surgery, please view our videos that explain the physical activity and discharge planning required to have the best surgical recovery at [facility].  [email or web address]  Questions? Call [phone] or email [email or web address]

## 2023-06-10 ENCOUNTER — Encounter: Payer: Self-pay | Admitting: Surgery

## 2023-06-10 ENCOUNTER — Ambulatory Visit

## 2023-06-10 ENCOUNTER — Ambulatory Visit: Admitting: Certified Registered"

## 2023-06-10 ENCOUNTER — Encounter
Admission: RE | Disposition: A | Discharge status: Home Health | Payer: Self-pay | Source: Ambulatory Visit | Attending: Surgery

## 2023-06-10 ENCOUNTER — Ambulatory Visit
Admission: RE | Admit: 2023-06-10 | Discharge: 2023-06-11 | Disposition: A | Discharge status: Home Health | Source: Ambulatory Visit | Attending: Surgery | Admitting: Surgery

## 2023-06-10 ENCOUNTER — Other Ambulatory Visit: Payer: Self-pay

## 2023-06-10 DIAGNOSIS — Z96651 Presence of right artificial knee joint: Secondary | ICD-10-CM

## 2023-06-10 DIAGNOSIS — Z791 Long term (current) use of non-steroidal anti-inflammatories (NSAID): Secondary | ICD-10-CM | POA: Diagnosis not present

## 2023-06-10 DIAGNOSIS — M1711 Unilateral primary osteoarthritis, right knee: Secondary | ICD-10-CM | POA: Insufficient documentation

## 2023-06-10 DIAGNOSIS — Z87891 Personal history of nicotine dependence: Secondary | ICD-10-CM | POA: Diagnosis not present

## 2023-06-10 DIAGNOSIS — Z7901 Long term (current) use of anticoagulants: Secondary | ICD-10-CM | POA: Diagnosis not present

## 2023-06-10 DIAGNOSIS — Z79899 Other long term (current) drug therapy: Secondary | ICD-10-CM | POA: Diagnosis not present

## 2023-06-10 DIAGNOSIS — Z01818 Encounter for other preprocedural examination: Secondary | ICD-10-CM

## 2023-06-10 HISTORY — PX: TOTAL KNEE ARTHROPLASTY: SHX125

## 2023-06-10 LAB — POCT PREGNANCY, URINE: Preg Test, Ur: NEGATIVE

## 2023-06-10 SURGERY — ARTHROPLASTY, KNEE, TOTAL
Anesthesia: Spinal | Site: Knee | Laterality: Right

## 2023-06-10 MED ORDER — METOCLOPRAMIDE HCL 5 MG/ML IJ SOLN: 5.0000 mg | Freq: Three times a day (TID) | INTRAMUSCULAR | Status: DC | PRN

## 2023-06-10 MED ORDER — VITAMIN D 25 MCG (1000 UNIT) PO TABS
1000.0000 [IU] | ORAL_TABLET | Freq: Every day | ORAL | Status: DC
  Administered 2023-06-10 – 2023-06-11 (×2): 1000 [IU] via ORAL
  Filled 2023-06-10 (×2): qty 1

## 2023-06-10 MED ORDER — LORATADINE 10 MG PO TABS
10.0000 mg | ORAL_TABLET | Freq: Every day | ORAL | Status: DC
  Administered 2023-06-11: 10 mg via ORAL
  Filled 2023-06-10: qty 1

## 2023-06-10 MED ORDER — CEFAZOLIN SODIUM-DEXTROSE 2-4 GM/100ML-% IV SOLN
2.0000 g | INTRAVENOUS | Status: AC
  Administered 2023-06-10: 2 g via INTRAVENOUS

## 2023-06-10 MED ORDER — TRANEXAMIC ACID-NACL 1000-0.7 MG/100ML-% IV SOLN
INTRAVENOUS | Status: AC
  Filled 2023-06-10: qty 100

## 2023-06-10 MED ORDER — OXYCODONE HCL 5 MG/5ML PO SOLN: 5.0000 mg | Freq: Once | ORAL | Status: DC | PRN

## 2023-06-10 MED ORDER — MONTELUKAST SODIUM 10 MG PO TABS
10.0000 mg | ORAL_TABLET | Freq: Every day | ORAL | Status: DC
  Administered 2023-06-11: 10 mg via ORAL
  Filled 2023-06-10: qty 1

## 2023-06-10 MED ORDER — MAGNESIUM HYDROXIDE 400 MG/5ML PO SUSP: 30.0000 mL | Freq: Every day | ORAL | Status: DC | PRN

## 2023-06-10 MED ORDER — OXYCODONE HCL 5 MG PO TABS
5.0000 mg | ORAL_TABLET | ORAL | Status: DC | PRN
  Administered 2023-06-10 (×2): 5 mg via ORAL
  Filled 2023-06-10 (×2): qty 1

## 2023-06-10 MED ORDER — PROPOFOL 10 MG/ML IV BOLUS
INTRAVENOUS | Status: AC
  Filled 2023-06-10: qty 20

## 2023-06-10 MED ORDER — ACETAMINOPHEN 500 MG PO TABS
1000.0000 mg | ORAL_TABLET | Freq: Four times a day (QID) | ORAL | Status: DC
  Administered 2023-06-10 (×2): 1000 mg via ORAL
  Filled 2023-06-10 (×2): qty 2

## 2023-06-10 MED ORDER — LIDOCAINE HCL URETHRAL/MUCOSAL 2 % EX GEL
CUTANEOUS | Status: DC | PRN
  Administered 2023-06-10: 1 via TOPICAL

## 2023-06-10 MED ORDER — CHLORHEXIDINE GLUCONATE 0.12 % MT SOLN
OROMUCOSAL | Status: AC
  Filled 2023-06-10: qty 15

## 2023-06-10 MED ORDER — ONDANSETRON HCL 4 MG/2ML IJ SOLN
INTRAMUSCULAR | Status: DC | PRN
  Administered 2023-06-10: 4 mg via INTRAVENOUS

## 2023-06-10 MED ORDER — TRIAMCINOLONE ACETONIDE 40 MG/ML IJ SUSP
INTRAMUSCULAR | Status: AC
  Filled 2023-06-10: qty 2

## 2023-06-10 MED ORDER — CHLORHEXIDINE GLUCONATE 0.12 % MT SOLN
15.0000 mL | Freq: Once | OROMUCOSAL | Status: AC
  Administered 2023-06-10: 15 mL via OROMUCOSAL

## 2023-06-10 MED ORDER — FLUTICASONE PROPIONATE 50 MCG/ACT NA SUSP: 1.0000 | Freq: Every day | NASAL | Status: DC | PRN

## 2023-06-10 MED ORDER — MIDAZOLAM HCL 5 MG/5ML IJ SOLN
INTRAMUSCULAR | Status: DC | PRN
  Administered 2023-06-10: 2 mg via INTRAVENOUS

## 2023-06-10 MED ORDER — BUPIVACAINE LIPOSOME 1.3 % IJ SUSP
INTRAMUSCULAR | Status: AC
  Filled 2023-06-10: qty 20

## 2023-06-10 MED ORDER — OLOPATADINE HCL 0.1 % OP SOLN
1.0000 [drp] | Freq: Every day | OPHTHALMIC | Status: DC
  Filled 2023-06-10: qty 5

## 2023-06-10 MED ORDER — TRAMADOL HCL 50 MG PO TABS: 50.0000 mg | ORAL_TABLET | Freq: Four times a day (QID) | ORAL | Status: DC | PRN

## 2023-06-10 MED ORDER — KETOROLAC TROMETHAMINE 30 MG/ML IJ SOLN
INTRAMUSCULAR | Status: AC
  Filled 2023-06-10: qty 1

## 2023-06-10 MED ORDER — SUMATRIPTAN SUCCINATE 50 MG PO TABS: 25.0000 mg | ORAL_TABLET | ORAL | Status: DC | PRN

## 2023-06-10 MED ORDER — KETOROLAC TROMETHAMINE 30 MG/ML IJ SOLN
INTRAMUSCULAR | Status: DC | PRN
  Administered 2023-06-10: 30 mg via INTRAMUSCULAR

## 2023-06-10 MED ORDER — DIPHENHYDRAMINE HCL 12.5 MG/5ML PO ELIX: 12.5000 mg | ORAL_SOLUTION | ORAL | Status: DC | PRN

## 2023-06-10 MED ORDER — ACETAMINOPHEN 10 MG/ML IV SOLN
INTRAVENOUS | Status: DC | PRN
  Administered 2023-06-10: 1000 mg via INTRAVENOUS

## 2023-06-10 MED ORDER — SODIUM CHLORIDE (PF) 0.9 % IJ SOLN
INTRAMUSCULAR | Status: AC
  Filled 2023-06-10: qty 40

## 2023-06-10 MED ORDER — MIDAZOLAM HCL 2 MG/2ML IJ SOLN
INTRAMUSCULAR | Status: AC
  Filled 2023-06-10: qty 2

## 2023-06-10 MED ORDER — 0.9 % SODIUM CHLORIDE (POUR BTL) OPTIME
TOPICAL | Status: DC | PRN
  Administered 2023-06-10: 500 mL

## 2023-06-10 MED ORDER — CEFAZOLIN SODIUM-DEXTROSE 2-4 GM/100ML-% IV SOLN
2.0000 g | Freq: Four times a day (QID) | INTRAVENOUS | Status: AC
  Administered 2023-06-10 (×2): 2 g via INTRAVENOUS
  Filled 2023-06-10 (×2): qty 100

## 2023-06-10 MED ORDER — PROPOFOL 1000 MG/100ML IV EMUL
INTRAVENOUS | Status: AC
  Filled 2023-06-10: qty 100

## 2023-06-10 MED ORDER — LACTATED RINGERS IV SOLN: INTRAVENOUS | Status: DC

## 2023-06-10 MED ORDER — TRANEXAMIC ACID-NACL 1000-0.7 MG/100ML-% IV SOLN
1000.0000 mg | INTRAVENOUS | Status: AC
  Administered 2023-06-10: 1000 mg via INTRAVENOUS

## 2023-06-10 MED ORDER — APIXABAN 2.5 MG PO TABS
2.5000 mg | ORAL_TABLET | Freq: Two times a day (BID) | ORAL | Status: DC
Start: 2023-06-11 — End: 2023-06-11
  Administered 2023-06-11: 2.5 mg via ORAL
  Filled 2023-06-10: qty 1

## 2023-06-10 MED ORDER — ACETAMINOPHEN 325 MG PO TABS: 325.0000 mg | ORAL_TABLET | Freq: Four times a day (QID) | ORAL | Status: DC | PRN

## 2023-06-10 MED ORDER — SODIUM CHLORIDE 0.9 % IR SOLN
Status: DC | PRN
  Administered 2023-06-10: 3000 mL

## 2023-06-10 MED ORDER — DOCUSATE SODIUM 100 MG PO CAPS
100.0000 mg | ORAL_CAPSULE | Freq: Two times a day (BID) | ORAL | Status: DC
  Administered 2023-06-10 – 2023-06-11 (×3): 100 mg via ORAL
  Filled 2023-06-10 (×3): qty 1

## 2023-06-10 MED ORDER — METOCLOPRAMIDE HCL 10 MG PO TABS: 5.0000 mg | ORAL_TABLET | Freq: Three times a day (TID) | ORAL | Status: DC | PRN

## 2023-06-10 MED ORDER — OXYCODONE HCL 5 MG PO TABS: 5.0000 mg | ORAL_TABLET | Freq: Once | ORAL | Status: DC | PRN

## 2023-06-10 MED ORDER — ONDANSETRON HCL 4 MG PO TABS: 4.0000 mg | ORAL_TABLET | Freq: Four times a day (QID) | ORAL | Status: DC | PRN

## 2023-06-10 MED ORDER — BUPIVACAINE HCL (PF) 0.5 % IJ SOLN
INTRAMUSCULAR | Status: DC | PRN
  Administered 2023-06-10: 2.6 mL

## 2023-06-10 MED ORDER — SODIUM CHLORIDE 0.9 % IV SOLN: INTRAVENOUS | Status: DC

## 2023-06-10 MED ORDER — SODIUM CHLORIDE 0.9 % IV SOLN
INTRAVENOUS | Status: DC | PRN
  Administered 2023-06-10: 60 mL

## 2023-06-10 MED ORDER — KETOROLAC TROMETHAMINE 15 MG/ML IJ SOLN
15.0000 mg | Freq: Four times a day (QID) | INTRAMUSCULAR | Status: DC
  Administered 2023-06-10 (×2): 15 mg via INTRAVENOUS
  Filled 2023-06-10 (×2): qty 1

## 2023-06-10 MED ORDER — PHENYLEPHRINE HCL-NACL 20-0.9 MG/250ML-% IV SOLN
INTRAVENOUS | Status: DC | PRN
Start: 2023-06-10 — End: 2023-06-10
  Administered 2023-06-10: 30 ug/min via INTRAVENOUS

## 2023-06-10 MED ORDER — DEXAMETHASONE SODIUM PHOSPHATE 10 MG/ML IJ SOLN
INTRAMUSCULAR | Status: DC | PRN
  Administered 2023-06-10: 10 mg via INTRAVENOUS

## 2023-06-10 MED ORDER — PROPOFOL 500 MG/50ML IV EMUL
INTRAVENOUS | Status: DC | PRN
  Administered 2023-06-10: 150 ug/kg/min via INTRAVENOUS

## 2023-06-10 MED ORDER — TRIAMCINOLONE ACETONIDE 40 MG/ML IJ SUSP
INTRAMUSCULAR | Status: DC | PRN
  Administered 2023-06-10: 80 mg via INTRAMUSCULAR

## 2023-06-10 MED ORDER — FLEET ENEMA RE ENEM: 1.0000 | ENEMA | Freq: Once | RECTAL | Status: DC | PRN

## 2023-06-10 MED ORDER — BUPIVACAINE-EPINEPHRINE (PF) 0.5% -1:200000 IJ SOLN
INTRAMUSCULAR | Status: DC | PRN
  Administered 2023-06-10: 30 mL via PERINEURAL

## 2023-06-10 MED ORDER — FENTANYL CITRATE (PF) 100 MCG/2ML IJ SOLN: 25.0000 ug | INTRAMUSCULAR | Status: DC | PRN

## 2023-06-10 MED ORDER — ORAL CARE MOUTH RINSE: 15.0000 mL | Freq: Once | OROMUCOSAL | Status: AC

## 2023-06-10 MED ORDER — CEFAZOLIN SODIUM-DEXTROSE 2-4 GM/100ML-% IV SOLN
INTRAVENOUS | Status: AC
  Filled 2023-06-10: qty 100

## 2023-06-10 MED ORDER — GABAPENTIN 400 MG PO CAPS
600.0000 mg | ORAL_CAPSULE | Freq: Three times a day (TID) | ORAL | Status: DC
  Administered 2023-06-10 – 2023-06-11 (×3): 600 mg via ORAL
  Filled 2023-06-10 (×3): qty 2

## 2023-06-10 MED ORDER — BISACODYL 10 MG RE SUPP: 10.0000 mg | Freq: Every day | RECTAL | Status: DC | PRN

## 2023-06-10 MED ORDER — PHENYLEPHRINE HCL-NACL 20-0.9 MG/250ML-% IV SOLN
INTRAVENOUS | Status: AC
  Filled 2023-06-10: qty 250

## 2023-06-10 MED ORDER — ADULT MULTIVITAMIN W/MINERALS CH
1.0000 | ORAL_TABLET | Freq: Every day | ORAL | Status: DC
Start: 2023-06-10 — End: 2023-06-11
  Administered 2023-06-10 – 2023-06-11 (×2): 1 via ORAL
  Filled 2023-06-10 (×2): qty 1

## 2023-06-10 MED ORDER — BUPIVACAINE-EPINEPHRINE (PF) 0.5% -1:200000 IJ SOLN
INTRAMUSCULAR | Status: AC
  Filled 2023-06-10: qty 30

## 2023-06-10 MED ORDER — ONDANSETRON HCL 4 MG/2ML IJ SOLN
4.0000 mg | Freq: Four times a day (QID) | INTRAMUSCULAR | Status: DC | PRN
Start: 2023-06-10 — End: 2023-06-11

## 2023-06-10 MED ORDER — KETOROLAC TROMETHAMINE 30 MG/ML IJ SOLN
30.0000 mg | Freq: Once | INTRAMUSCULAR | Status: AC
  Administered 2023-06-10: 30 mg via INTRAVENOUS

## 2023-06-10 SURGICAL SUPPLY — 56 items
BLADE SAW 90X13X1.19 OSCILLAT (BLADE) ×1 IMPLANT
BLADE SAW SAG 25X90X1.19 (BLADE) ×1 IMPLANT
BLADE SURG SZ20 CARB STEEL (BLADE) ×1 IMPLANT
BNDG ELASTIC 6X5.8 VLCR NS LF (GAUZE/BANDAGES/DRESSINGS) ×1 IMPLANT
CEMENT VACUUM MIXING SYSTEM (MISCELLANEOUS) ×1 IMPLANT
CHLORAPREP W/TINT 26 (MISCELLANEOUS) ×1 IMPLANT
COMPONENT FEM PERSONA SZ6 RT (Joint) IMPLANT
COMPONENT PATELLA PEG 3 32 (Joint) IMPLANT
COOLER POLAR GLACIER W/PUMP (MISCELLANEOUS) ×1 IMPLANT
COVER MAYO STAND STRL (DRAPES) ×1 IMPLANT
CUFF TRNQT CYL 24X4X16.5-23 (TOURNIQUET CUFF) IMPLANT
CUFF TRNQT CYL 30X4X21-28X (TOURNIQUET CUFF) IMPLANT
CUFF TRNQT CYL 34X4.125X (TOURNIQUET CUFF) IMPLANT
DRAPE IMP U-DRAPE 54X76 (DRAPES) ×1 IMPLANT
DRAPE SHEET LG 3/4 BI-LAMINATE (DRAPES) ×1 IMPLANT
DRAPE U-SHAPE 47X51 STRL (DRAPES) ×1 IMPLANT
DRSG MEPILEX SACRM 8.7X9.8 (GAUZE/BANDAGES/DRESSINGS) IMPLANT
DRSG OPSITE POSTOP 4X10 (GAUZE/BANDAGES/DRESSINGS) ×1 IMPLANT
DRSG OPSITE POSTOP 4X8 (GAUZE/BANDAGES/DRESSINGS) ×1 IMPLANT
ELECT CAUTERY BLADE 6.4 (BLADE) ×1 IMPLANT
ELECTRODE REM PT RTRN 9FT ADLT (ELECTROSURGICAL) ×1 IMPLANT
GAUZE XEROFORM 1X8 LF (GAUZE/BANDAGES/DRESSINGS) ×1 IMPLANT
GLOVE BIO SURGEON STRL SZ7.5 (GLOVE) ×4 IMPLANT
GLOVE BIO SURGEON STRL SZ8 (GLOVE) ×4 IMPLANT
GLOVE BIOGEL PI IND STRL 8 (GLOVE) ×1 IMPLANT
GLOVE INDICATOR 8.0 STRL GRN (GLOVE) ×1 IMPLANT
GOWN STRL REUS W/ TWL LRG LVL3 (GOWN DISPOSABLE) ×1 IMPLANT
GOWN STRL REUS W/ TWL XL LVL3 (GOWN DISPOSABLE) ×1 IMPLANT
HOOD PEEL AWAY T7 (MISCELLANEOUS) ×3 IMPLANT
KIT TURNOVER KIT A (KITS) ×1 IMPLANT
LINER ASF PERS 10X6/7 CD RT (Liner) IMPLANT
MANIFOLD NEPTUNE II (INSTRUMENTS) ×1 IMPLANT
NDL SPNL 20GX3.5 QUINCKE YW (NEEDLE) ×1 IMPLANT
NEEDLE SPNL 20GX3.5 QUINCKE YW (NEEDLE) ×1 IMPLANT
NS IRRIG 500ML POUR BTL (IV SOLUTION) ×1 IMPLANT
PACK TOTAL KNEE (MISCELLANEOUS) ×1 IMPLANT
PAD WRAPON POLAR KNEE (MISCELLANEOUS) ×1 IMPLANT
PENCIL SMOKE EVACUATOR (MISCELLANEOUS) ×1 IMPLANT
PIN DRILL HDLS TROCAR 75 4PK (PIN) IMPLANT
PUTTY DBX 1CC (Putty) ×1 IMPLANT
PUTTY DBX 1CC DEPUY (Putty) IMPLANT
SCREW FEMALE HEX FIX 25X2.5 (ORTHOPEDIC DISPOSABLE SUPPLIES) IMPLANT
SOL .9 NS 3000ML IRR UROMATIC (IV SOLUTION) ×1 IMPLANT
STAPLER SKIN PROX 35W (STAPLE) ×1 IMPLANT
STEM TIB PS KNEE D 0D RT (Stem) IMPLANT
STOCKINETTE IMPERV 14X48 (MISCELLANEOUS) ×1 IMPLANT
SUCTION TUBE FRAZIER 10FR DISP (SUCTIONS) ×1 IMPLANT
SUT VIC AB 0 CT1 36 (SUTURE) ×3 IMPLANT
SUT VIC AB 2-0 CT1 TAPERPNT 27 (SUTURE) ×3 IMPLANT
SYR 10ML LL (SYRINGE) ×1 IMPLANT
SYR 20ML LL LF (SYRINGE) ×1 IMPLANT
SYR 30ML LL (SYRINGE) IMPLANT
TIP FAN IRRIG PULSAVAC PLUS (DISPOSABLE) ×1 IMPLANT
TRAP FLUID SMOKE EVACUATOR (MISCELLANEOUS) ×1 IMPLANT
WATER STERILE IRR 1000ML POUR (IV SOLUTION) IMPLANT
WATER STERILE IRR 500ML POUR (IV SOLUTION) ×1 IMPLANT

## 2023-06-10 NOTE — Discharge Instructions (Signed)

## 2023-06-10 NOTE — Anesthesia Procedure Notes (Signed)
 Spinal  Patient location during procedure: OR Start time: 06/10/2023 7:35 AM End time: 06/10/2023 7:41 AM Reason for block: surgical anesthesia Staffing Performed: resident/CRNA  Resident/CRNA: Jean Michaelis., CRNA Performed by: Jean Michaelis., CRNA Authorized by: Nancey Awkward, MD   Preanesthetic Checklist Completed: patient identified, IV checked, site marked, risks and benefits discussed, surgical consent, monitors and equipment checked, pre-op evaluation and timeout performed Spinal Block Patient position: sitting Prep: Betadine Patient monitoring: heart rate, continuous pulse ox, blood pressure and cardiac monitor Approach: midline Location: L4-5 Injection technique: single-shot Needle Needle type: Whitacre and Introducer  Needle gauge: 24 G Needle length: 9 cm Assessment Events: CSF return Additional Notes Negative paresthesia. Negative blood return. Positive free-flowing CSF. Expiration date of kit checked and confirmed. Patient tolerated procedure well, without complications.

## 2023-06-10 NOTE — H&P (Signed)
 History of Present Illness:  Courtney Rivas is a 58 y.o. female who presents for a history and physical for an upcoming right total knee arthroplasty to be done by Dr. Daun Epstein on Jun 10, 2023. The patient saw Dr. Daun Epstein in February for follow-up of her right knee pain secondary to degenerative joint disease. The patient was last seen for these symptoms 3 months ago. At this visit, she received a steroid injection which she states provided moderate temporary relief of her symptoms, lasting 5 to 6 weeks before her symptoms began to recur. She again notes moderate pain in her knee which she rates at 2/10 on today's visit, but feels that it can be worse with any prolonged standing or ambulation. She notes that she was not consistent with her home exercise program during the winter holidays. In addition, she was involved in a project at work requiring her to stay at the office for 10 to 12 hours or longer regularly for 3 weeks or more. They brought food in to help get the job done, but she feels that she put back on much of the weight that she had taken off previously. Her symptoms again are worse with any prolonged standing or ambulation. She has difficulty reciprocating stairs, and has pain at night. She denies any reinjury to the knee, and denies any numbness or paresthesias down her leg to her foot. The patient is not a diabetic.  Current Outpatient Medications:  acetaminophen  (TYLENOL ) 500 MG tablet Take 1,000 mg by mouth once daily as needed for Pain  cholecalciferol (VITAMIN D3) 400 unit tablet Take by mouth once daily  diclofenac (VOLTAREN) 1 % topical gel APPLY TOPICALLY TO THE AFFECTED AREA FOUR TIMES DAILY AS DIRECTED  fluticasone propionate (FLONASE NASAL) Place into one nostril once as needed  gabapentin (NEURONTIN) 300 MG capsule Take 2 capsules p.o. 3 times daily 180 capsule 5  meloxicam (MOBIC) 7.5 MG tablet Take 7.5 mg by mouth as needed  montelukast (SINGULAIR) 10 mg tablet Take 10 mg by mouth  once daily  omega 3-dha-epa-fish oil (FISH OIL) 1,000 mg (120 mg-180 mg) Cap Take 1 capsule by mouth once daily  SUMAtriptan (IMITREX) 25 MG tablet TAKE 1 TABLET BY MOUTH ONCE AS DIRECTED  traMADoL (ULTRAM) 50 mg tablet Take 1 tablet (50 mg total) by mouth 2 (two) times daily as needed 30 tablet 0  amitriptyline (ELAVIL) 10 MG tablet Take 10 mg by mouth at bedtime (Patient not taking: Reported on 05/02/2023)  amoxicillin (AMOXIL) 500 MG capsule take 1 capsule by mouth every 8 hours for 10 days  azelastine (OPTIVAR) 0.05 % ophthalmic solution Apply to eye  diphenhydramine HCl (BENADRYL ALLERGY ORAL) Take by mouth once as needed   Allergies:  Iodinated Contrast Media Hives  Pollen Extracts Unknown   Past Medical History: No past medical history on file.  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  Physical Exam: Vitals:  06/02/23 1524 06/02/23 1525  BP: (!) 144/84  Weight: 93 kg (205 lb)  Height: 162.6 cm (5\' 4" )  PainSc: 8  PainLoc: Knee   General/Constitutional: The patient appears to be well-nourished, well-developed, and in no acute distress. Neuro/Psych: Normal mood and affect, oriented to  person, place and time.  Heart: Examination of the heart reveals regular, rate, and rhythm. There is no murmur noted on ascultation. There is a normal apical pulse.  Lungs: Lungs are clear to auscultation. There is no wheeze, rhonchi, or crackles. There is normal expansion of bilateral chest walls.   Right knee exam: The patient ambulates with a minimal limp, but is not using any assistive devices. Skin inspection of the right knee is notable for a well-healed scar from kneeling on a piece of glass many years ago, but otherwise remains unremarkable. No erythema, ecchymosis, abrasions, or other skin abnormalities are identified. There is at most a trace effusion. She notes mild tenderness along the medial joint line, but denies any lateral or peripatellar tenderness. Actively, she is able to extend her knee fully and flex her knee to 120 degrees without discomfort. Her patella tracks well, but is notable for mild patellofemoral crepitance. There is no laxity to varus or valgus stressing, nor with a Lachman's maneuver. She remains neurovascularly intact to the right lower extremity and foot.  Assessment: 1. Primary osteoarthritis of right knee.  2. Status post total knee replacement using cement, left.  3. Adult BMI 35.0-35.9 kg/sq m.   Plan: The treatment options were discussed with the patient. In addition, patient educational materials were provided regarding the diagnosis and treatment options. The patient would like to pursue doing a right knee replacement surgery. The patient will be set up for a right total knee arthroplasty to be done on Jun 10, 2023. I reviewed the risk, benefits, and alternatives to the surgery. The patient will most likely spend 1 night in the hospital postoperatively, because of support at home. This injection is performed sterilely as described below. I have recommended a home exercise program and weight loss. She may continue with her normal daily activities, but is to  avoid offending activities. She may take over-the-counter medications as needed for discomfort. She may continue to work without restrictions. All of the patient's questions and concerns were answered. She can call any time with further concerns. The patient will follow-up after surgery for postoperative care.    H&P reviewed and patient re-examined. No changes.

## 2023-06-10 NOTE — Transfer of Care (Signed)
 Immediate Anesthesia Transfer of Care Note  Patient: Courtney Rivas  Procedure(s) Performed: ARTHROPLASTY, KNEE, TOTAL (Right: Knee)  Patient Location: PACU  Anesthesia Type:General  Level of Consciousness: awake and patient cooperative  Airway & Oxygen Therapy: Patient Spontanous Breathing  Post-op Assessment: Report given to RN and Post -op Vital signs reviewed and stable  Post vital signs: stable  Last Vitals:  Vitals Value Taken Time  BP 114/73 06/10/23 1031  Temp 36.2 C 06/10/23 0945  Pulse 63 06/10/23 1033  Resp 10 06/10/23 1033  SpO2 99 % 06/10/23 1033  Vitals shown include unfiled device data.  Last Pain:  Vitals:   06/10/23 0945  TempSrc:   PainSc: 0-No pain         Complications: No notable events documented.

## 2023-06-10 NOTE — Evaluation (Signed)
 Physical Therapy Evaluation Patient Details Name: Courtney Rivas MRN: [id number] DOB: May 02, 1965 Today's Date: 06/10/2023  History of Present Illness  58 y/o female s/p R TKA on 06/10/23  Clinical Impression  Patient admitted following the above procedure. PTA, patient lives alone and was independent with mobility with no AD. Patient with return of sensation in R LE prior to session. Completed bed mobility modI. Stood from bedside and toilet with CGA and RW. Ambulated 120' with RW and CGA, cues for R heel strike to emphasize knee extension. Educated patient on limb positioning, HEP handout, and importance of mobility, patient verbalized understanding. Patient will benefit from skilled PT services during acute stay to address listed deficits. Patient will benefit from ongoing therapy at discharge to maximize functional independence and safety.         If plan is discharge home, recommend the following: Assist for transportation;Help with stairs or ramp for entrance   Can travel by private vehicle        Equipment Recommendations None recommended by PT  Recommendations for Other Services       Functional Status Assessment Patient has had a recent decline in their functional status and demonstrates the ability to make significant improvements in function in a reasonable and predictable amount of time.     Precautions / Restrictions Precautions Precautions: Fall;Knee Precaution Booklet Issued: Yes (comment) Recall of Precautions/Restrictions: Intact Restrictions Weight Bearing Restrictions Per Provider Order: Yes RLE Weight Bearing Per Provider Order: Weight bearing as tolerated      Mobility  Bed Mobility Overal bed mobility: Modified Independent             General bed mobility comments: HOB elevated    Transfers Overall transfer level: Needs assistance Equipment used: Rolling Rosanne Wohlfarth (2 wheels) Transfers: Sit to/from Stand Sit to Stand: Contact guard assist                 Ambulation/Gait Ambulation/Gait assistance: Contact guard assist Gait Distance (Feet): 120 Feet Assistive device: Rolling Jedrick Hutcherson (2 wheels) Gait Pattern/deviations: Step-to pattern, Decreased stride length, Decreased stance time - right Gait velocity: decreased     General Gait Details: CGA for safety. Cues for heel strike on R for emphasis on knee extension  Stairs            Wheelchair Mobility     Tilt Bed    Modified Rankin (Stroke Patients Only)       Balance Overall balance assessment: Needs assistance Sitting-balance support: No upper extremity supported, Feet supported Sitting balance-Leahy Scale: Good     Standing balance support: Bilateral upper extremity supported, Reliant on assistive device for balance Standing balance-Leahy Scale: Fair                               Pertinent Vitals/Pain Pain Assessment Pain Assessment: Faces Faces Pain Scale: Hurts a little bit Pain Location: R knee Pain Descriptors / Indicators: Discomfort, Grimacing Pain Intervention(s): Limited activity within patient's tolerance, Monitored during session, Repositioned    Home Living Family/patient expects to be discharged to:: Private residence Living Arrangements: Alone Available Help at Discharge: Family;Available PRN/intermittently Type of Home: Apartment Home Access: Stairs to enter   Entrance Stairs-Number of Steps: 1   Home Layout: One level Home Equipment: Agricultural consultant (2 wheels);Cane - single point;Shower seat;Toilet riser      Prior Function Prior Level of Function : Independent/Modified Independent;Working/employed;Driving  Extremity/Trunk Assessment   Upper Extremity Assessment Upper Extremity Assessment: Overall WFL for tasks assessed    Lower Extremity Assessment Lower Extremity Assessment: RLE deficits/detail RLE Deficits / Details: deficits consistent with post op pain and weakness     Cervical / Trunk Assessment Cervical / Trunk Assessment: Normal  Communication   Communication Communication: No apparent difficulties    Cognition Arousal: Alert Behavior During Therapy: WFL for tasks assessed/performed   PT - Cognitive impairments: No apparent impairments                         Following commands: Intact       Cueing       General Comments      Exercises Other Exercises Other Exercises: HEP handout provided and reviewed   Assessment/Plan    PT Assessment Patient needs continued PT services  PT Problem List Decreased range of motion;Decreased strength;Decreased activity tolerance;Decreased balance;Decreased mobility;Decreased knowledge of precautions;Decreased safety awareness;Decreased knowledge of use of DME;Pain       PT Treatment Interventions DME instruction;Gait training;Functional mobility training;Therapeutic activities;Therapeutic exercise;Balance training;Stair training;Patient/family education    PT Goals (Current goals can be found in the Care Plan section)  Acute Rehab PT Goals Patient Stated Goal: to go home PT Goal Formulation: With patient Time For Goal Achievement: 06/24/23 Potential to Achieve Goals: Good    Frequency BID     Co-evaluation               AM-PAC PT "6 Clicks" Mobility  Outcome Measure Help needed turning from your back to your side while in a flat bed without using bedrails?: None Help needed moving from lying on your back to sitting on the side of a flat bed without using bedrails?: None Help needed moving to and from a bed to a chair (including a wheelchair)?: A Little Help needed standing up from a chair using your arms (e.g., wheelchair or bedside chair)?: A Little Help needed to walk in hospital room?: A Little Help needed climbing 3-5 steps with a railing? : A Little 6 Click Score: 20    End of Session Equipment Utilized During Treatment: Gait belt Activity Tolerance: Patient  tolerated treatment well Patient left: in chair;with call bell/phone within reach Nurse Communication: Mobility status PT Visit Diagnosis: Unsteadiness on feet (R26.81);Muscle weakness (generalized) (M62.81);Difficulty in walking, not elsewhere classified (R26.2)    Time: 9562-1308 PT Time Calculation (min) (ACUTE ONLY): 25 min   Charges:   PT Evaluation $PT Eval Moderate Complexity: 1 Mod PT Treatments $Therapeutic Activity: 8-22 mins PT General Charges $$ ACUTE PT VISIT: 1 Visit         Janine Melbourne, PT, DPT Physical Therapist - [facility]  [facility]   Mihail Prettyman A Jacquelina Hewins 06/10/2023, 3:26 PM

## 2023-06-10 NOTE — Op Note (Signed)
 06/10/2023  9:51 AM  Patient:   Courtney Rivas  Pre-Op Diagnosis:   Degenerative joint disease, right knee.  Post-Op Diagnosis:   Same  Procedure:   Right TKA using all-pressfit Zimmer Persona system with a #6 PCR femur, a(n) D-sized  tibial tray with a 10 mm medial congruent E-poly insert, and a 9 x 32 mm all-poly 3-pegged domed patella.  Surgeon:   Lonnie Roberts, MD  Assistant:   Edie Goon, PA-C   Anesthesia:   Spinal  Findings:   As above  Complications:   None  EBL:   10 cc  Fluids:   500 cc crystalloid  UOP:   None  TT:   80 minutes at 300 mmHg  Drains:   None  Closure:   Staples  Implants:   As above  Brief Clinical Note:   The patient is a 58 year old female with a long history of progressively worsening right knee pain. The patient's symptoms have progressed despite medications, activity modification, injections, etc. The patient's history and examination were consistent with advanced degenerative joint disease of the right knee confirmed by plain radiographs. The patient presents at this time for a right total knee arthroplasty.  Procedure:   The patient was brought into the operating room. After adequate spinal anesthesia was obtained, the patient was repositioned in the supine position on the operating room table. The right lower extremity was prepped with ChloraPrep solution and draped sterilely. Preoperative antibiotics were administered. A timeout was performed to verify the appropriate surgical site before the limb was exsanguinated with an Esmarch and the tourniquet inflated to 300 mmHg.   A standard anterior approach to the knee was made through an approximately 6-7 inch incision. The incision was carried down through the subcutaneous tissues to expose superficial retinaculum. This was split the length of the incision and the medial flap elevated sufficiently to expose the medial retinaculum. The medial retinaculum was incised, leaving a 3-4 mm cuff of  tissue on the patella. This was extended distally along the medial border of the patellar tendon and proximally through the medial third of the quadriceps tendon. A subtotal fat pad excision was performed before the soft tissues were elevated off the anteromedial and anterolateral aspects of the proximal tibia to the level of the collateral ligaments. The anterior portions of the medial and lateral menisci were removed, as was the anterior cruciate ligament. With the knee flexed to 90, the external tibial guide was positioned and the appropriate proximal tibial cut made. This piece was taken to the back table where it was measured and found to be optimally replicated by a(n) D-sized component.  Attention was directed to the distal femur. The intramedullary canal was accessed through a 3/8" drill hole. The intramedullary guide was inserted and placed at 5 of valgus alignment. Using the +0 slot, the distal cut was made. The distal femur was measured and found to be optimally replicated by the #6 component. The #6 4-in-1 cutting block was positioned and first the posterior, then the posterior chamfer, the anterior, and finally the anterior chamfer cuts were made after verifying that the anterior cortex would not be notched.   At this point, the posterior portions medial and lateral menisci were removed. A trial reduction was performed using the appropriate femoral and tibial components with the 10 mm insert. This demonstrated excellent stability to varus and valgus stressing both in flexion and extension while permitting full extension. Patellar tracking was assessed and found to be excellent. The  tibial trial position was marked on the proximal tibia. The patella thickness was measured and found to be 20 mm, so the appropriate cut was made. The patellar surface was measured and found to be optimally replicated by the 32 mm component. The three peg holes were drilled in place before the trial button was inserted.  Patella tracking was assessed and found to be excellent, passing the "no thumb test". The lug holes were drilled into the distal femur before the trial component was removed.  The tibial tray was repositioned before the keel was created using the appropriate tower, drills, and punch.  The bony surfaces were prepared for implantation by irrigating them thoroughly with sterile saline solution via the jet lavage system. A bone plug was fashioned from some of the bone that had been removed previously and used to plug the distal femoral canal. In addition, a "cocktail" of 20 cc of Exparel, 30 cc of 0.5% Sensorcaine, 2 cc of Kenalog 40 (80 mg), and 30 mg of Toradol diluted out to 90 cc with normal saline was injected into the postero-medial and postero-lateral aspects of the knee, the medial and lateral gutter regions, and the peri-incisional tissues to help with postoperative analgesia.    The tibial tray was impacted into place first with care taken to be sure the component was fully seated. Next, the femoral component was impacted into place again with care taken to be sure that the component was fully improperly seated. The permanent 10 mm medial congruent E-polyethylene insert was snapped into place with care taken to ensure appropriate locking of the insert. Finally, the patella was positioned and compressed into place using the patellar clamp. Again, care was taken to be sure that the component was fully seated. The knee was placed through a range of motion with the findings as described above.    The wound was copiously irrigated with sterile saline solution using the jet lavage system before the quadriceps tendon and retinacular layer were reapproximated using #0 Vicryl interrupted sutures. The superficial retinacular layer also was closed using a running #0 Vicryl suture. The subcutaneous tissues were closed in several layers using 2-0 Vicryl interrupted sutures. The skin was closed using staples. A  sterile honeycomb dressing was applied to the skin before the leg was wrapped with an Ace wrap to accommodate the Polar Care device. The patient was then awakened and returned to the recovery room in satisfactory condition after tolerating the procedure well.

## 2023-06-10 NOTE — Anesthesia Preprocedure Evaluation (Addendum)
 Anesthesia Evaluation  Patient identified by MRN, date of birth, ID band Patient awake    Reviewed: Allergy & Precautions, NPO status , Patient's Chart, lab work & pertinent test results  History of Anesthesia Complications Negative for: history of anesthetic complications  Airway Mallampati: I  TM Distance: >3 FB Neck ROM: full    Dental no notable dental hx.    Pulmonary former smoker   Pulmonary exam normal        Cardiovascular negative cardio ROS Normal cardiovascular exam     Neuro/Psych negative neurological ROS  negative psych ROS   GI/Hepatic negative GI ROS, Neg liver ROS,,,  Endo/Other  negative endocrine ROS    Renal/GU negative Renal ROS  negative genitourinary   Musculoskeletal negative musculoskeletal ROS (+)    Abdominal   Peds  Hematology negative hematology ROS (+)   Anesthesia Other Findings Past Medical History: No date: Headache  Past Surgical History: No date: TOTAL KNEE ARTHROPLASTY; Left  BMI    Body Mass Index: 34.33 kg/m      Reproductive/Obstetrics negative OB ROS                             Anesthesia Physical Anesthesia Plan  ASA: 2  Anesthesia Plan: Spinal   Post-op Pain Management: Regional block*, Ofirmev  IV (intra-op)* and Toradol IV (intra-op)*   Induction: Intravenous  PONV Risk Score and Plan: 2 and Propofol infusion and TIVA  Airway Management Planned: Natural Airway and Nasal Cannula  Additional Equipment:   Intra-op Plan:   Post-operative Plan:   Informed Consent: I have reviewed the patients History and Physical, chart, labs and discussed the procedure including the risks, benefits and alternatives for the proposed anesthesia with the patient or authorized representative who has indicated his/her understanding and acceptance.     Dental Advisory Given  Plan Discussed with: Anesthesiologist, CRNA and Surgeon  Anesthesia  Plan Comments: (Patient reports no bleeding problems and no anticoagulant use.  Plan for spinal with backup GA  Patient consented for risks of anesthesia including but not limited to:  - adverse reactions to medications - damage to eyes, teeth, lips or other oral mucosa - nerve damage due to positioning  - risk of bleeding, infection and or nerve damage from spinal that could lead to paralysis - risk of headache or failed spinal - damage to teeth, lips or other oral mucosa - sore throat or hoarseness - damage to heart, brain, nerves, lungs, other parts of body or loss of life  Patient voiced understanding and assent.)       Anesthesia Quick Evaluation

## 2023-06-10 NOTE — Discharge Summary (Signed)
 Physician Discharge Summary  Patient ID: Courtney Rivas MRN: [id number] DOB/AGE: 1965/10/02 58 y.o.  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   Consultants (if any):   Discharged Condition: Improved  Hospital Course: Courtney Rivas is an 58 y.o. female who was admitted 06/10/2023 with a diagnosis of right knee degenerative joint disease and went to the operating room on 06/10/2023 and underwent the above named procedures.    Surgeries: Procedure(s): ARTHROPLASTY, KNEE, TOTAL on 06/10/2023 Patient tolerated the surgery well. Taken to PACU where she was stabilized and then transferred to the post op recovery area.  Started on Eliquis 2.5mg  every 12 hrs. Heels elevated on bed with rolled towels. No evidence of DVT. Negative Homan. Physical therapy started on day #1 for gait training and transfer. OT started day #1 for ADL and assisted devices.  Patient's IV was removed on POD1  Implants: Right TKA using all-pressfit Zimmer Persona system with a #6 PCR femur, a(n) D-sized  tibial tray with a 10 mm medial congruent E-poly insert, and a 9 x 32 mm all-poly 3-pegged domed patella.   She was given perioperative antibiotics:  Anti-infectives (From admission, onward)    Start     Dose/Rate Route Frequency Ordered Stop   06/10/23 1400  ceFAZolin (ANCEF) IVPB 2g/100 mL premix        2 g 200 mL/hr over 30 Minutes Intravenous Every 6 hours 06/10/23 1354 06/10/23 2000   06/10/23 0615  ceFAZolin (ANCEF) IVPB 2g/100 mL premix        2 g 200 mL/hr over 30 Minutes Intravenous On call to O.R. 06/10/23 0600 06/10/23 0752      .  She was given sequential compression devices, early ambulation, and Eliquis for DVT prophylaxis.  She benefited maximally from the hospital stay and there were no complications.    Recent vital signs:  Vitals:   06/10/23 2317 06/11/23 0602  BP: (!) 162/85 122/70  Pulse:  72  Resp:  16  Temp:  98.1 F (36.7 C)  SpO2:  96%    Recent laboratory studies:  Lab Results  Component Value Date   HGB 14.0 06/02/2023   HGB 13.0 04/12/2020   Lab Results  Component Value Date   WBC 7.0 06/02/2023   PLT 329 06/02/2023   No results found for: "INR" Lab Results  Component Value Date   NA 137 06/02/2023   K 3.9 06/02/2023   CL 106 06/02/2023   CO2 23 06/02/2023   BUN 17 06/02/2023   CREATININE 0.48 06/02/2023   GLUCOSE 91 06/02/2023    Discharge Medications:   Allergies as of 06/11/2023       Reactions   Ivp Dye [iodinated Contrast Media] Hives        Medication List     STOP taking these medications    meloxicam 7.5 MG tablet Commonly known as: MOBIC       TAKE these medications    acetaminophen  500 MG tablet Commonly known as: TYLENOL  Take 2 tablets (1,000 mg total) by mouth every 6 (six) hours.   apixaban 2.5 MG Tabs tablet Commonly known as: ELIQUIS Take 1 tablet (  2.5 mg total) by mouth 2 (two) times daily.   cetirizine 10 MG tablet Commonly known as: ZYRTEC Take 10 mg by mouth daily.   cholecalciferol 25 MCG (1000 UNIT) tablet Commonly known as: VITAMIN D3 Take 1,000 Units by mouth daily.   Fish Oil 1000 MG Caps Take 1,000 mg by mouth in the morning and at bedtime.   fluticasone 50 MCG/ACT nasal spray Commonly known as: FLONASE Place 1 spray into both nostrils daily as needed for allergies or rhinitis.   gabapentin 300 MG capsule Commonly known as: NEURONTIN Take 600 mg by mouth 3 (three) times daily.   montelukast 10 MG tablet Commonly known as: SINGULAIR Take 10 mg by mouth daily.   multivitamin with minerals tablet Take 1 tablet  by mouth daily.   ondansetron  4 MG tablet Commonly known as: ZOFRAN  Take 1 tablet (4 mg total) by mouth every 6 (six) hours as needed for nausea.   OVER THE COUNTER MEDICATION Take 1 capsule by mouth 2 (two) times daily. Omaprem (Fish Oil Supplements with Omega 3 Fatty Acids)   oxyCODONE  5 MG immediate release tablet Commonly known as: Oxy IR/ROXICODONE  Take 1-2 tablets (5-10 mg total) by mouth every 4 (four) hours as needed for moderate pain (pain score 4-6).   Pataday 0.1 % ophthalmic solution Generic drug: olopatadine Place 1 drop into both eyes daily.   SUMAtriptan 25 MG tablet Commonly known as: IMITREX Take 25 mg by mouth every 2 (two) hours as needed for migraine.   traMADol 50 MG tablet Commonly known as: ULTRAM Take 50 mg by mouth every 6 (six) hours as needed for severe pain (pain score 7-10).        Diagnostic Studies: DG Knee Right Port Result Date: 06/10/2023 CLINICAL DATA:  Status post right knee replacement. EXAM: PORTABLE RIGHT KNEE - 1-2 VIEW COMPARISON:  None Available. FINDINGS: Right knee arthroplasty in expected alignment. No periprosthetic lucency or fracture. Recent postsurgical change includes air and edema in the soft tissues and joint space. Anterior skin staples in place. IMPRESSION: Right knee arthroplasty without immediate postoperative complication. Electronically Signed   By: Chadwick Colonel M.D.   On: 06/10/2023 11:57   Disposition: Discharge disposition: 06-Home-Health Care Svc     Plan for discharge home pending progress with PT.   Follow-up Information     Bert Britain, PA-C Follow up in 14 day(s).   Specialty: Orthopedic Surgery Why: Staple Removal. Contact information: [address] [place] [address] [phone]                Signed: Coralyn Derry 06/11/2023, 8:13 AM

## 2023-06-11 ENCOUNTER — Encounter: Payer: Self-pay | Admitting: Surgery

## 2023-06-11 DIAGNOSIS — M1711 Unilateral primary osteoarthritis, right knee: Secondary | ICD-10-CM | POA: Diagnosis not present

## 2023-06-11 MED ORDER — ACETAMINOPHEN 500 MG PO TABS
1000.0000 mg | ORAL_TABLET | Freq: Four times a day (QID) | ORAL | Status: DC
Start: 2023-06-11 — End: 2023-06-11
  Administered 2023-06-11: 1000 mg via ORAL
  Filled 2023-06-11: qty 2

## 2023-06-11 MED ORDER — OXYCODONE HCL 5 MG PO TABS
5.0000 mg | ORAL_TABLET | ORAL | 0 refills | Status: AC | PRN
Start: 2023-06-11 — End: ?

## 2023-06-11 MED ORDER — KETOROLAC TROMETHAMINE 15 MG/ML IJ SOLN
15.0000 mg | Freq: Four times a day (QID) | INTRAMUSCULAR | Status: DC
  Administered 2023-06-11: 15 mg via INTRAVENOUS
  Filled 2023-06-11: qty 1

## 2023-06-11 MED ORDER — ACETAMINOPHEN 500 MG PO TABS: 1000.0000 mg | ORAL_TABLET | Freq: Four times a day (QID) | ORAL | Status: AC

## 2023-06-11 MED ORDER — ONDANSETRON HCL 4 MG PO TABS
4.0000 mg | ORAL_TABLET | Freq: Four times a day (QID) | ORAL | 0 refills | Status: AC | PRN
Start: 2023-06-11 — End: ?

## 2023-06-11 MED ORDER — APIXABAN 2.5 MG PO TABS
2.5000 mg | ORAL_TABLET | Freq: Two times a day (BID) | ORAL | 0 refills | Status: AC
Start: 2023-06-11 — End: ?

## 2023-06-11 NOTE — Anesthesia Postprocedure Evaluation (Signed)
 Anesthesia Post Note  Patient: Courtney Rivas  Procedure(s) Performed: ARTHROPLASTY, KNEE, TOTAL (Right: Knee)  Patient location during evaluation: Nursing Unit Anesthesia Type: Spinal Level of consciousness: oriented and awake and alert Pain management: pain level controlled Vital Signs Assessment: post-procedure vital signs reviewed and stable Respiratory status: spontaneous breathing and respiratory function stable Cardiovascular status: blood pressure returned to baseline and stable Postop Assessment: no headache, no backache, no apparent nausea or vomiting and patient able to bend at knees Anesthetic complications: no   There were no known notable events for this encounter.   Last Vitals:  Vitals:   06/11/23 0602 06/11/23 0818  BP: 122/70 139/78  Pulse: 72 62  Resp: 16 16  Temp: 36.7 C 36.8 C  SpO2: 96% 97%    Last Pain:  Vitals:   06/11/23 0818  TempSrc: Oral  PainSc: 0-No pain                 Kavin Parsley P

## 2023-06-11 NOTE — Plan of Care (Signed)
 Progressing towards discharge

## 2023-06-11 NOTE — Progress Notes (Signed)
 DISCHARGE NOTE:  Pt given discharge instructions and verbalized understanding. TED hose on both legs. Pt wheeled to car by staff, friend providing transportation.

## 2023-06-11 NOTE — Progress Notes (Signed)
   Subjective: 1 Day Post-Op Procedure(s) (LRB): ARTHROPLASTY, KNEE, TOTAL (Right) Patient reports pain as mild.   Patient is well, and has had no acute complaints or problems Denies any CP, SOB, ABD pain. We will continue therapy today.  Plan is to go Home after hospital stay.  Objective: Vital signs in last 24 hours: Temp:  [97.2 F (36.2 C)-98.4 F (36.9 C)] 98.1 F (36.7 C) (05/14 0602) Pulse Rate:  [65-89] 72 (05/14 0602) Resp:  [11-20] 16 (05/14 0602) BP: (111-173)/(61-86) 122/70 (05/14 0602) SpO2:  [95 %-99 %] 96 % (05/14 0602)  Intake/Output from previous day: 05/13 0701 - 05/14 0700 In: 1363.3 [P.O.:200; I.V.:763.3; IV Piggyback:400] Out: 1110 [Urine:1100; Blood:10] Intake/Output this shift: No intake/output data recorded.  No results for input(s): "HGB" in the last 72 hours. No results for input(s): "WBC", "RBC", "HCT", "PLT" in the last 72 hours. No results for input(s): "NA", "K", "CL", "CO2", "BUN", "CREATININE", "GLUCOSE", "CALCIUM" in the last 72 hours. No results for input(s): "LABPT", "INR" in the last 72 hours.  EXAM General - Patient is Alert, Appropriate, and Oriented Extremity - Neurovascular intact Sensation intact distally Intact pulses distally Dorsiflexion/Plantar flexion intact No cellulitis present Compartment soft Dressing - dressing C/D/I Motor Function - intact, moving foot and toes well on exam.   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   Weight as of this encounter: 90.7 kg. Advance diet Up with therapy Pain well controlled VSS CM to assist with discharge to home with HHPT today  DVT Prophylaxis - TED hose and SCDs  Eliquis Weight-Bearing as tolerated to right leg   T. Thomos Flies, PA-C [facility] 06/11/2023, 8:10 AM

## 2023-06-11 NOTE — Progress Notes (Signed)
 Physical Therapy Treatment Patient Details Name: Courtney Rivas MRN: [id number] DOB: 04-29-1965 Today's Date: 06/11/2023   History of Present Illness 58 y/o female s/p R TKA on 06/10/23    PT Comments  Pt was long sitting in bed. Agrees to session and remains cooperative throughout. Pt demonstrated safe abilities to exit bed, stand to RW, an tolerate ambulation > 200 ft. Session included performance of 1 step/curb to simulate home entry. HHPT recommended at DC to maximize her independence and safety with all ADLs     If plan is discharge home, recommend the following: Assist for transportation;Help with stairs or ramp for entrance     Equipment Recommendations  None recommended by PT       Precautions / Restrictions Precautions Precautions: Fall;Knee Precaution Booklet Issued: Yes (comment) Recall of Precautions/Restrictions: Intact Restrictions Weight Bearing Restrictions Per Provider Order: Yes RLE Weight Bearing Per Provider Order: Weight bearing as tolerated     Mobility  Bed Mobility Overal bed mobility: Modified Independent   Transfers Overall transfer level: Modified independent Equipment used: Rolling walker (2 wheels) Transfers: Sit to/from Stand Sit to Stand: Modified independent (Device/Increase time)     Ambulation/Gait Ambulation/Gait assistance: Modified independent (Device/Increase time) Gait Distance (Feet): 200 Feet Assistive device: Rolling walker (2 wheels) Gait Pattern/deviations: Step-to pattern, Step-through pattern Gait velocity: decreased  General Gait Details: Pt tolerated ambulation ~ 200 ft with RW. antalgic step to pattern progressed to step through   Stairs Stairs: Yes Stairs assistance: Contact guard assist, Supervision Stair Management: Step to pattern, Backwards, With walker Number of Stairs: 1 General stair comments: pt demonstrated safe abilities to ascend/descend 1 step to simulate home entry    Balance Overall balance assessment:  Needs assistance Sitting-balance support: No upper extremity supported, Feet supported Sitting balance-Leahy Scale: Good  Standing balance support: Bilateral upper extremity supported Standing balance-Leahy Scale: Good       Communication Communication Communication: No apparent difficulties  Cognition Arousal: Alert Behavior During Therapy: WFL for tasks assessed/performed   PT - Cognitive impairments: No apparent impairments    PT - Cognition Comments: Pt is A and O x 4 Following commands: Intact      Cueing Cueing Techniques: Verbal cues     General Comments General comments (skin integrity, edema, etc.): Discussed at length DC needs and expectations. pt perform several stretches in sitting with AROM 2-89 degrees      Pertinent Vitals/Pain Pain Assessment Pain Assessment: 0-10 Pain Score: 4  Pain Location: R knee/ L knee pain at times due to previous L TKA Pain Descriptors / Indicators: Discomfort, Grimacing Pain Intervention(s): Limited activity within patient's tolerance, Monitored during session, Premedicated before session, Repositioned, Ice applied     PT Goals (current goals can now be found in the care plan section) Acute Rehab PT Goals Patient Stated Goal: to go home Progress towards PT goals: Progressing toward goals    Frequency    BID       AM-PAC PT "6 Clicks" Mobility   Outcome Measure  Help needed turning from your back to your side while in a flat bed without using bedrails?: None Help needed moving from lying on your back to sitting on the side of a flat bed without using bedrails?: None Help needed moving to and from a bed to a chair (including a wheelchair)?: A Little Help needed standing up from a chair using your arms (e.g., wheelchair or bedside chair)?: A Little Help needed to walk in hospital room?: A Little Help needed  climbing 3-5 steps with a railing? : A Little 6 Click Score: 20    End of Session   Activity Tolerance: Patient  tolerated treatment well Patient left: in chair;with call bell/phone within reach Nurse Communication: Mobility status PT Visit Diagnosis: Unsteadiness on feet (R26.81);Muscle weakness (generalized) (M62.81);Difficulty in walking, not elsewhere classified (R26.2)     Time: 6295-2841 PT Time Calculation (min) (ACUTE ONLY): 24 min  Charges:    $Gait Training: 8-22 mins $Therapeutic Activity: 8-22 mins PT General Charges $$ ACUTE PT VISIT: 1 Visit                     Chester Costa PTA 06/11/23, 8:23 AM

## 2023-06-11 NOTE — TOC Transition Note (Signed)
 Transition of Care [facility]) - Discharge Note   Patient Details  Name: Courtney Rivas MRN: [id number] Date of Birth: 1965-04-22  [facility] CM/SW Contact:  Alexandra Ice, RN Phone Number: 06/11/2023, 8:14 AM   Clinical Narrative:      Patient to discharge home with home health services. No DME needs identified by PT during session. Patient set up with [facility] by surgeon's office prior to surgery. Added agency information to AVS. No additional TOC needs identified, will continue to be available if needed.     Patient Goals and CMS Choice Patient states their goals for this hospitalization and ongoing recovery are:: go home and get better      Expected Discharge Plan and Services           Expected Discharge Date: 06/11/23                 DME Agency: NA       HH Arranged: OT, PT HH Agency: [facility] Date HH Agency Contacted: 06/11/23 Time HH Agency Contacted: [phone] Representative spoke with at [facility] Agency: [place]   Prior Living Arrangements/Services                       Activities of Daily Living   ADL Screening (condition at time of admission) Independently performs ADLs?: Yes (appropriate for developmental age) Is the patient deaf or have difficulty hearing?: No Does the patient have difficulty seeing, even when wearing glasses/contacts?: No Does the patient have difficulty concentrating, remembering, or making decisions?: No  Permission Sought/Granted                  Emotional Assessment              Admission diagnosis:  Primary osteoarthritis of right knee [M17.11] 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, 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] Patient Active Problem List   Diagnosis Date Noted   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, right 06/10/2023   PCP:  Fannie Homestead, MD Pharmacy:   [facility] [phone] [facility] - [address] AT [facility] OF [address] [place] [address] Phone: [phone] Fax: [phone]     Social Determinants of Health (SDOH) Interventions    Readmission Risk Interventions     No data to display            Final next level of care: Home w Home Health Services     Patient Goals and CMS Choice Patient states their goals for this hospitalization and ongoing recovery are:: go home and get better          Discharge Placement                  Name of family member notified: [name] Patient and family notified of of transfer: 06/11/23  Discharge Plan and Services Additional resources added to the After Visit Summary for                    DME Agency: NA       HH Arranged: OT, PT HH Agency: [facility] Date [facility] Agency Contacted: 06/11/23 Time HH Agency Contacted: [phone] Representative spoke with at [facility] Agency: [place]   Social Drivers of Health (SDOH) Interventions SDOH Screenings   Housing: Unknown (03/07/2023)   Received from [facility]  Tobacco Use: Medium Risk (06/02/2023)   Received from  [facility]     Readmission Risk Interventions     No data to display

## 2023-06-11 NOTE — Plan of Care (Signed)
  Problem: Activity: Goal: Risk for activity intolerance will decrease Outcome: Progressing   Problem: Elimination: Goal: Will not experience complications related to urinary retention Outcome: Progressing   Problem: Pain Managment: Goal: General experience of comfort will improve and/or be controlled Outcome: Progressing

## 2023-08-08 ENCOUNTER — Encounter: Payer: Self-pay | Admitting: Surgery

## 2023-09-23 ENCOUNTER — Other Ambulatory Visit: Payer: Self-pay | Admitting: Physical Medicine & Rehabilitation

## 2023-09-23 ENCOUNTER — Ambulatory Visit
Admission: RE | Admit: 2023-09-23 | Discharge: 2023-09-23 | Disposition: A | Discharge status: Home / Self Care | Source: Ambulatory Visit | Attending: Physical Medicine & Rehabilitation | Admitting: Physical Medicine & Rehabilitation

## 2023-09-23 DIAGNOSIS — G8929 Other chronic pain: Secondary | ICD-10-CM
